# Patient Record
Sex: Female | Born: 1980 | Race: Black or African American | Hispanic: No | Marital: Single
Health system: Southern US, Community
[De-identification: ages and names within clinical notes are randomized; demographics above are authoritative.]

## PROBLEM LIST (undated history)

## (undated) DIAGNOSIS — F319 Bipolar disorder, unspecified: Secondary | ICD-10-CM

## (undated) DIAGNOSIS — F32A Depression, unspecified: Secondary | ICD-10-CM

## (undated) DIAGNOSIS — F329 Major depressive disorder, single episode, unspecified: Secondary | ICD-10-CM

## (undated) DIAGNOSIS — F909 Attention-deficit hyperactivity disorder, unspecified type: Secondary | ICD-10-CM

## (undated) HISTORY — PX: HAND SURGERY: SHX662

## (undated) HISTORY — PX: TUBAL LIGATION: SHX77

## (undated) HISTORY — PX: DENTAL SURGERY: SHX609

## (undated) HISTORY — PX: BUNIONECTOMY: SHX129

## (undated) HISTORY — PX: TONSILLECTOMY: SUR1361

---
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
## 2000-07-21 ENCOUNTER — Encounter: Admission: RE | Admit: 2000-07-21 | Discharge: 2000-07-21 | Payer: Self-pay

## 2002-07-20 ENCOUNTER — Emergency Department (HOSPITAL_COMMUNITY): Admission: EM | Admit: 2002-07-20 | Discharge: 2002-07-20 | Payer: Self-pay | Admitting: Emergency Medicine

## 2004-03-15 ENCOUNTER — Emergency Department (HOSPITAL_COMMUNITY): Admission: EM | Admit: 2004-03-15 | Discharge: 2004-03-15 | Payer: Self-pay | Admitting: Emergency Medicine

## 2004-03-31 ENCOUNTER — Emergency Department (HOSPITAL_COMMUNITY): Admission: EM | Admit: 2004-03-31 | Discharge: 2004-03-31 | Payer: Self-pay

## 2004-05-28 ENCOUNTER — Emergency Department (HOSPITAL_COMMUNITY): Admission: EM | Admit: 2004-05-28 | Discharge: 2004-05-28 | Payer: Self-pay | Admitting: Emergency Medicine

## 2004-05-29 ENCOUNTER — Emergency Department (HOSPITAL_COMMUNITY): Admission: EM | Admit: 2004-05-29 | Discharge: 2004-05-29 | Payer: Self-pay | Admitting: Emergency Medicine

## 2004-06-20 ENCOUNTER — Emergency Department (HOSPITAL_COMMUNITY): Admission: EM | Admit: 2004-06-20 | Discharge: 2004-06-20 | Payer: Self-pay | Admitting: Emergency Medicine

## 2004-07-24 ENCOUNTER — Emergency Department (HOSPITAL_COMMUNITY): Admission: EM | Admit: 2004-07-24 | Discharge: 2004-07-24 | Payer: Self-pay | Admitting: Emergency Medicine

## 2004-11-21 ENCOUNTER — Emergency Department (HOSPITAL_COMMUNITY): Admission: EM | Admit: 2004-11-21 | Discharge: 2004-11-21 | Payer: Self-pay | Admitting: Emergency Medicine

## 2004-12-21 ENCOUNTER — Emergency Department (HOSPITAL_COMMUNITY): Admission: EM | Admit: 2004-12-21 | Discharge: 2004-12-21 | Payer: Self-pay | Admitting: Emergency Medicine

## 2005-03-19 ENCOUNTER — Emergency Department (HOSPITAL_COMMUNITY): Admission: EM | Admit: 2005-03-19 | Discharge: 2005-03-19 | Payer: Self-pay | Admitting: Emergency Medicine

## 2005-05-04 ENCOUNTER — Emergency Department (HOSPITAL_COMMUNITY): Admission: EM | Admit: 2005-05-04 | Discharge: 2005-05-04 | Payer: Self-pay | Admitting: Emergency Medicine

## 2005-07-24 ENCOUNTER — Inpatient Hospital Stay (HOSPITAL_COMMUNITY): Admission: AD | Admit: 2005-07-24 | Discharge: 2005-07-24 | Payer: Self-pay | Admitting: Gynecology

## 2005-08-09 ENCOUNTER — Inpatient Hospital Stay (HOSPITAL_COMMUNITY): Admission: AD | Admit: 2005-08-09 | Discharge: 2005-08-09 | Payer: Self-pay | Admitting: Obstetrics and Gynecology

## 2005-08-12 ENCOUNTER — Ambulatory Visit (HOSPITAL_COMMUNITY): Admission: RE | Admit: 2005-08-12 | Discharge: 2005-08-12 | Payer: Self-pay | Admitting: Obstetrics and Gynecology

## 2005-08-20 ENCOUNTER — Ambulatory Visit: Payer: Self-pay | Admitting: *Deleted

## 2005-08-20 ENCOUNTER — Ambulatory Visit (HOSPITAL_COMMUNITY): Admission: RE | Admit: 2005-08-20 | Discharge: 2005-08-20 | Payer: Self-pay | Admitting: Obstetrics and Gynecology

## 2005-09-04 ENCOUNTER — Encounter: Payer: Self-pay | Admitting: Emergency Medicine

## 2005-09-04 ENCOUNTER — Observation Stay (HOSPITAL_COMMUNITY): Admission: AD | Admit: 2005-09-04 | Discharge: 2005-09-05 | Payer: Self-pay | Admitting: Gynecology

## 2005-09-27 ENCOUNTER — Inpatient Hospital Stay (HOSPITAL_COMMUNITY): Admission: AD | Admit: 2005-09-27 | Discharge: 2005-09-27 | Payer: Self-pay | Admitting: Obstetrics & Gynecology

## 2006-01-12 ENCOUNTER — Inpatient Hospital Stay (HOSPITAL_COMMUNITY): Admission: AD | Admit: 2006-01-12 | Discharge: 2006-01-12 | Payer: Self-pay | Admitting: Obstetrics

## 2006-01-18 ENCOUNTER — Inpatient Hospital Stay (HOSPITAL_COMMUNITY): Admission: AD | Admit: 2006-01-18 | Discharge: 2006-01-23 | Payer: Self-pay | Admitting: Obstetrics & Gynecology

## 2006-01-20 ENCOUNTER — Encounter (INDEPENDENT_AMBULATORY_CARE_PROVIDER_SITE_OTHER): Payer: Self-pay | Admitting: *Deleted

## 2006-06-24 ENCOUNTER — Emergency Department (HOSPITAL_COMMUNITY): Admission: EM | Admit: 2006-06-24 | Discharge: 2006-06-24 | Payer: Self-pay | Admitting: Emergency Medicine

## 2006-07-23 ENCOUNTER — Emergency Department (HOSPITAL_COMMUNITY): Admission: EM | Admit: 2006-07-23 | Discharge: 2006-07-23 | Payer: Self-pay | Admitting: *Deleted

## 2006-09-09 ENCOUNTER — Ambulatory Visit (HOSPITAL_COMMUNITY): Admission: RE | Admit: 2006-09-09 | Discharge: 2006-09-09 | Payer: Self-pay | Admitting: Obstetrics & Gynecology

## 2006-11-22 ENCOUNTER — Emergency Department (HOSPITAL_COMMUNITY): Admission: EM | Admit: 2006-11-22 | Discharge: 2006-11-22 | Payer: Self-pay | Admitting: Emergency Medicine

## 2007-01-04 ENCOUNTER — Emergency Department (HOSPITAL_COMMUNITY): Admission: EM | Admit: 2007-01-04 | Discharge: 2007-01-04 | Payer: Self-pay | Admitting: Emergency Medicine

## 2007-05-03 ENCOUNTER — Emergency Department (HOSPITAL_COMMUNITY): Admission: EM | Admit: 2007-05-03 | Discharge: 2007-05-03 | Payer: Self-pay | Admitting: Emergency Medicine

## 2007-06-27 ENCOUNTER — Emergency Department (HOSPITAL_COMMUNITY): Admission: EM | Admit: 2007-06-27 | Discharge: 2007-06-27 | Payer: Self-pay | Admitting: Emergency Medicine

## 2007-08-03 ENCOUNTER — Emergency Department (HOSPITAL_COMMUNITY): Admission: EM | Admit: 2007-08-03 | Discharge: 2007-08-03 | Payer: Self-pay | Admitting: Emergency Medicine

## 2007-09-14 ENCOUNTER — Emergency Department (HOSPITAL_COMMUNITY): Admission: EM | Admit: 2007-09-14 | Discharge: 2007-09-14 | Payer: Self-pay | Admitting: Emergency Medicine

## 2008-02-06 ENCOUNTER — Inpatient Hospital Stay (HOSPITAL_COMMUNITY): Admission: AD | Admit: 2008-02-06 | Discharge: 2008-02-07 | Payer: Self-pay | Admitting: Obstetrics

## 2008-02-08 ENCOUNTER — Encounter: Payer: Self-pay | Admitting: Obstetrics

## 2008-02-08 ENCOUNTER — Inpatient Hospital Stay (HOSPITAL_COMMUNITY): Admission: AD | Admit: 2008-02-08 | Discharge: 2008-02-08 | Payer: Self-pay | Admitting: Obstetrics

## 2008-02-25 ENCOUNTER — Emergency Department (HOSPITAL_COMMUNITY): Admission: EM | Admit: 2008-02-25 | Discharge: 2008-02-25 | Payer: Self-pay | Admitting: Emergency Medicine

## 2008-03-11 ENCOUNTER — Emergency Department (HOSPITAL_COMMUNITY): Admission: EM | Admit: 2008-03-11 | Discharge: 2008-03-11 | Payer: Self-pay | Admitting: Emergency Medicine

## 2008-11-02 ENCOUNTER — Ambulatory Visit (HOSPITAL_COMMUNITY): Admission: RE | Admit: 2008-11-02 | Discharge: 2008-11-02 | Payer: Self-pay | Admitting: Obstetrics & Gynecology

## 2008-11-23 ENCOUNTER — Ambulatory Visit (HOSPITAL_COMMUNITY): Admission: RE | Admit: 2008-11-23 | Discharge: 2008-11-23 | Payer: Self-pay | Admitting: Obstetrics & Gynecology

## 2008-12-14 ENCOUNTER — Ambulatory Visit (HOSPITAL_COMMUNITY): Admission: RE | Admit: 2008-12-14 | Discharge: 2008-12-14 | Payer: Self-pay | Admitting: Obstetrics & Gynecology

## 2008-12-23 ENCOUNTER — Inpatient Hospital Stay (HOSPITAL_COMMUNITY): Admission: AD | Admit: 2008-12-23 | Discharge: 2008-12-23 | Payer: Self-pay | Admitting: Obstetrics & Gynecology

## 2008-12-23 ENCOUNTER — Ambulatory Visit: Payer: Self-pay | Admitting: Obstetrics and Gynecology

## 2009-01-11 ENCOUNTER — Ambulatory Visit (HOSPITAL_COMMUNITY): Admission: RE | Admit: 2009-01-11 | Discharge: 2009-01-11 | Payer: Self-pay | Admitting: Obstetrics & Gynecology

## 2009-02-08 ENCOUNTER — Ambulatory Visit (HOSPITAL_COMMUNITY): Admission: RE | Admit: 2009-02-08 | Discharge: 2009-02-08 | Payer: Self-pay | Admitting: Obstetrics & Gynecology

## 2009-03-08 ENCOUNTER — Emergency Department (HOSPITAL_COMMUNITY): Admission: EM | Admit: 2009-03-08 | Discharge: 2009-03-08 | Payer: Self-pay | Admitting: Emergency Medicine

## 2009-03-16 IMAGING — CR DG RIBS W/ CHEST 3+V*R*
5 series · 5 of 5 positions shown · non-contrast
Comparison: none

CLINICAL DATA: Right lower anterior rib pain since a fall [REDACTED].
 RIGHT RIBS WITH PA CHEST:

[w chest pa]
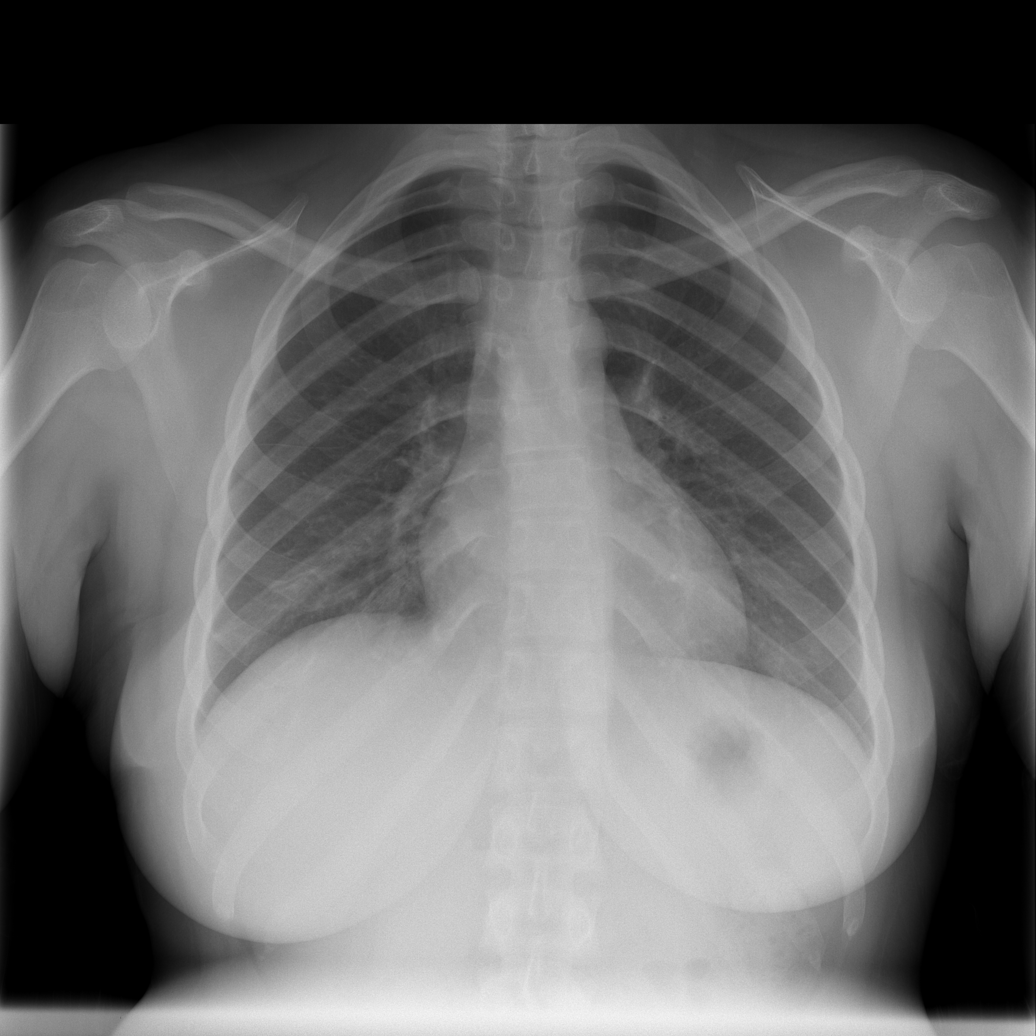

[w ribs ap/pa upper right]
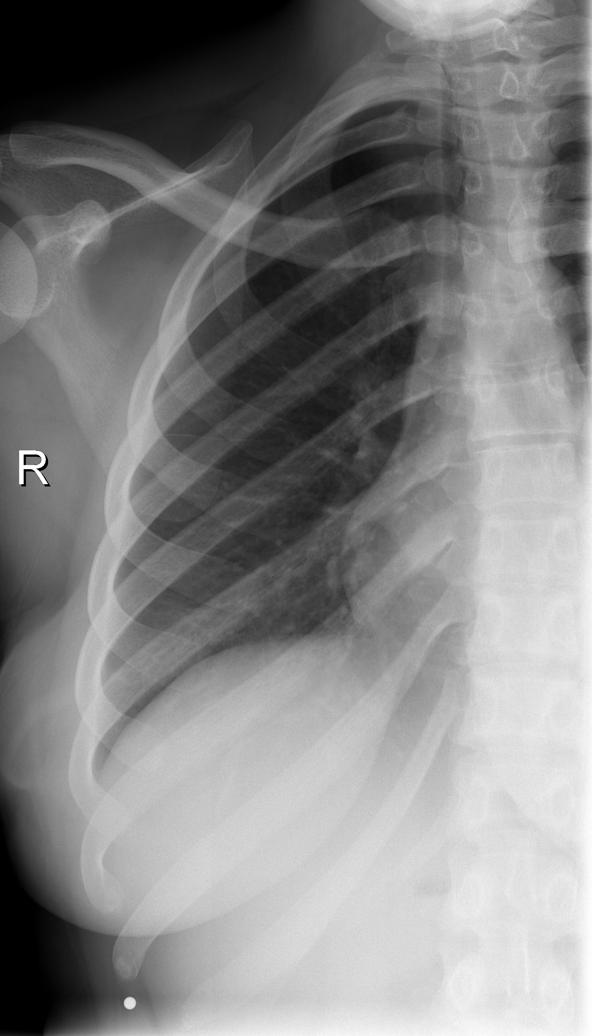

[w ribs ap/pa lower right]
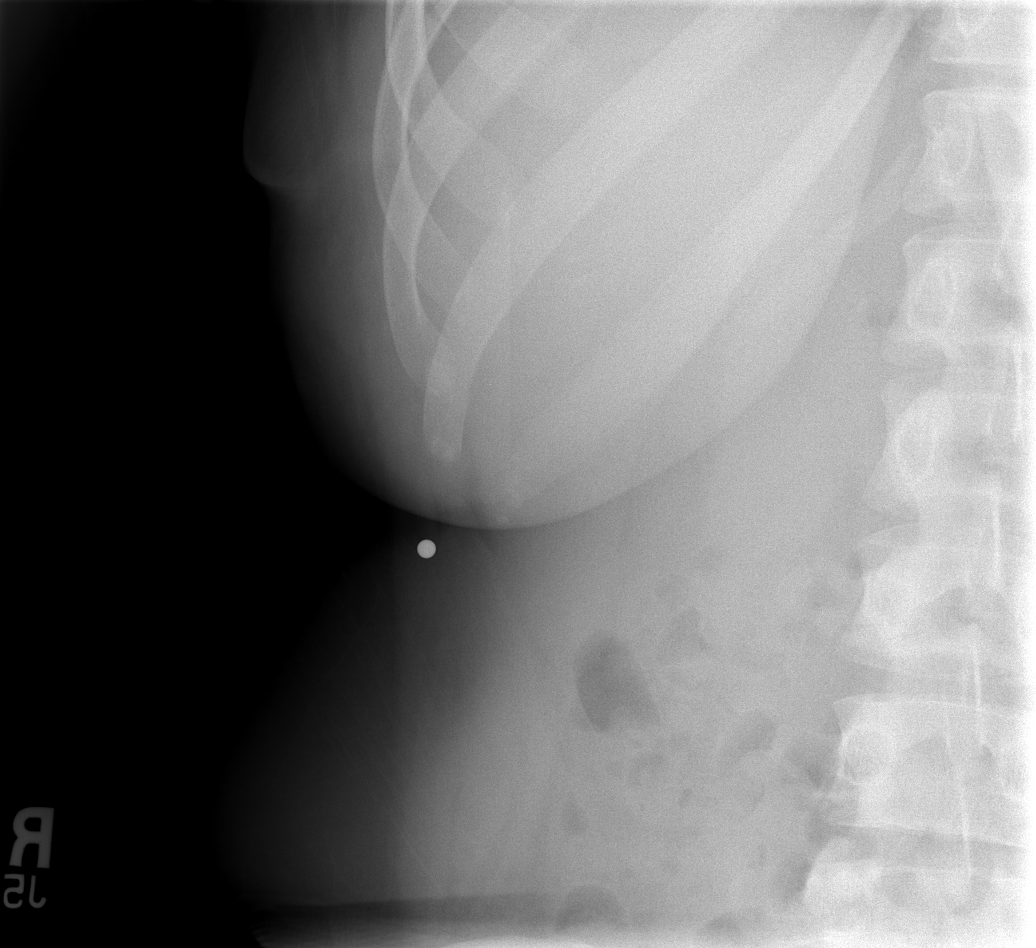

[w ribs oblique right * (1 of 2)]
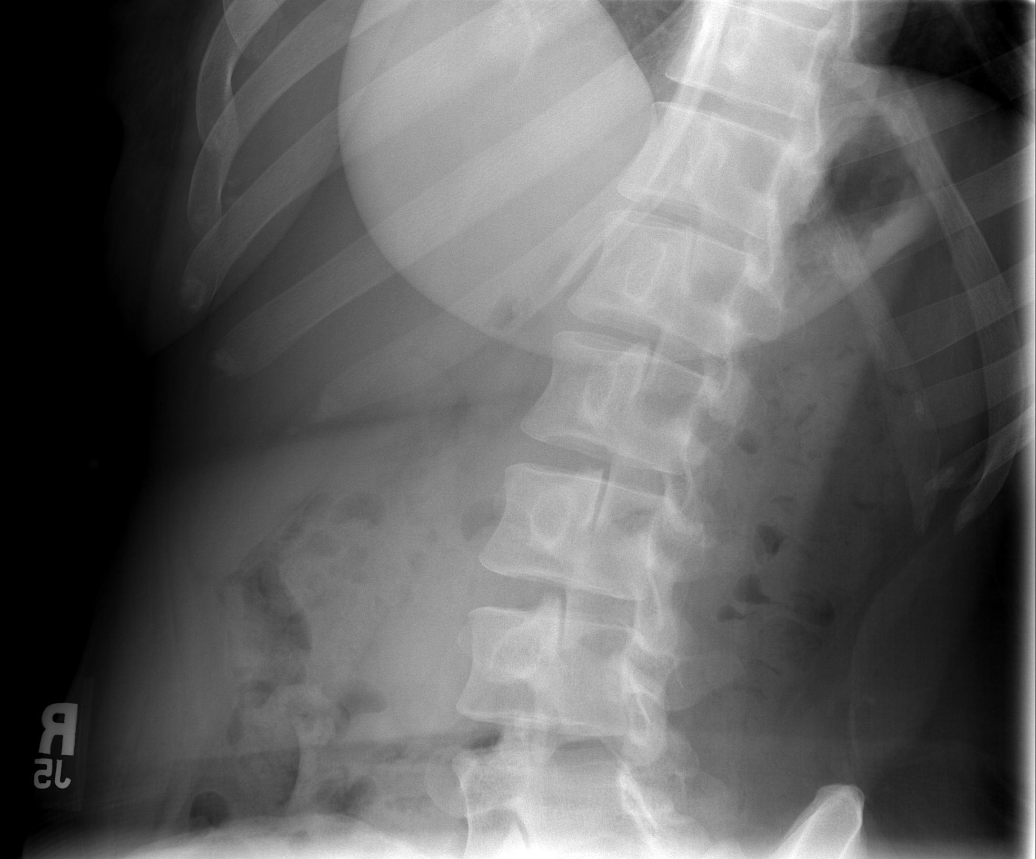

[w ribs oblique right * (2 of 2)]
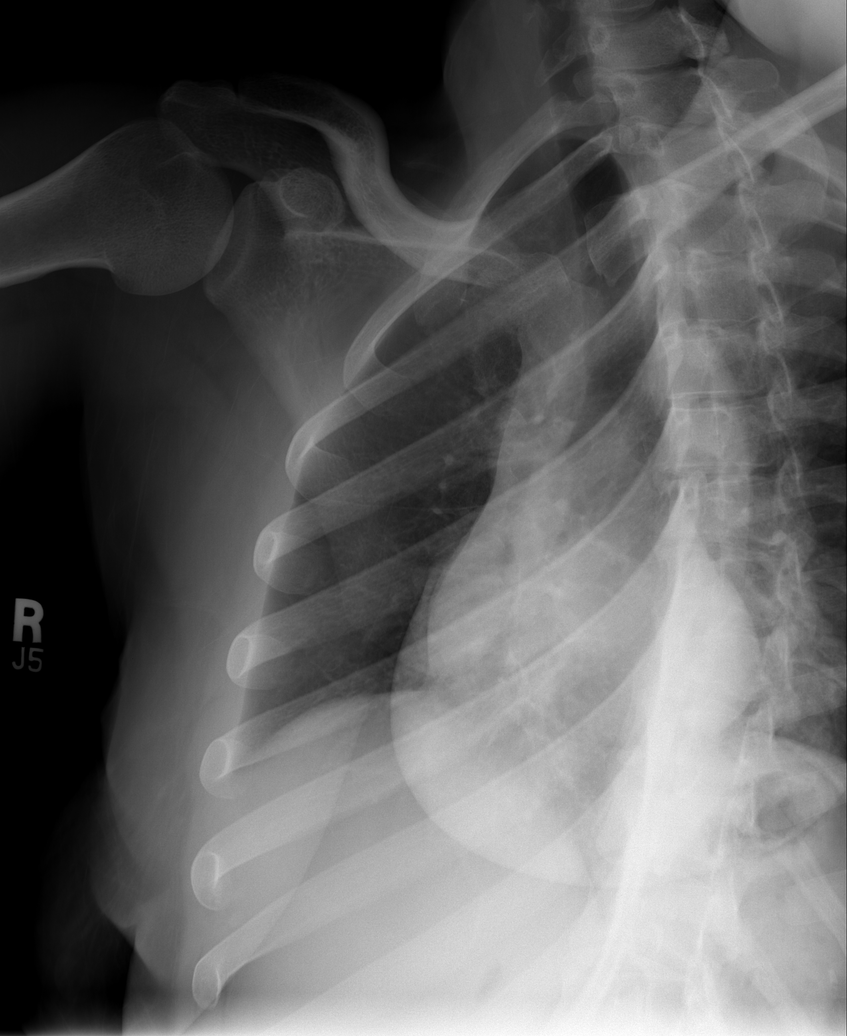

[5 of 5 positions shown; findings below may reference images not displayed]

FINDINGS: There is no visible definitive fracture, although there is slight irregularity of the anterior aspect of the right 9th rib at the costochondral junction which could represent a slight cortical fracture.  The patient?s pain was marked with a BB and this does have overlie the 9th to 10th costochondral junction region.  There is some mild atelectasis of the right lung base, but there is no pneumothorax. Lungs are otherwise clear.
IMPRESSION: No definitive fracture.  However, a single view does demonstrate some slight cortical irregularity of the anterior aspect of the right 9th rib, which may be a subtle fracture. Mild right base atelectasis.

## 2009-03-17 ENCOUNTER — Inpatient Hospital Stay (HOSPITAL_COMMUNITY): Admission: AD | Admit: 2009-03-17 | Discharge: 2009-03-17 | Payer: Self-pay | Admitting: Obstetrics & Gynecology

## 2009-03-23 ENCOUNTER — Inpatient Hospital Stay (HOSPITAL_COMMUNITY): Admission: AD | Admit: 2009-03-23 | Discharge: 2009-03-23 | Payer: Self-pay | Admitting: Obstetrics

## 2009-04-05 ENCOUNTER — Ambulatory Visit (HOSPITAL_COMMUNITY): Admission: RE | Admit: 2009-04-05 | Discharge: 2009-04-05 | Payer: Self-pay | Admitting: Obstetrics & Gynecology

## 2009-04-30 ENCOUNTER — Inpatient Hospital Stay (HOSPITAL_COMMUNITY): Admission: AD | Admit: 2009-04-30 | Discharge: 2009-05-03 | Payer: Self-pay | Admitting: Obstetrics & Gynecology

## 2009-04-30 ENCOUNTER — Encounter: Payer: Self-pay | Admitting: Obstetrics & Gynecology

## 2009-06-22 ENCOUNTER — Other Ambulatory Visit: Payer: Self-pay | Admitting: Obstetrics & Gynecology

## 2009-08-26 ENCOUNTER — Emergency Department (HOSPITAL_COMMUNITY): Admission: EM | Admit: 2009-08-26 | Discharge: 2009-08-26 | Payer: Self-pay | Admitting: Emergency Medicine

## 2009-09-24 ENCOUNTER — Inpatient Hospital Stay (HOSPITAL_COMMUNITY): Admission: AD | Admit: 2009-09-24 | Discharge: 2009-09-24 | Payer: Self-pay | Admitting: Obstetrics & Gynecology

## 2010-01-13 ENCOUNTER — Emergency Department (HOSPITAL_COMMUNITY): Admission: EM | Admit: 2010-01-13 | Discharge: 2010-01-13 | Payer: Self-pay | Admitting: Emergency Medicine

## 2010-05-27 ENCOUNTER — Encounter: Payer: Self-pay | Admitting: Obstetrics & Gynecology

## 2010-05-28 ENCOUNTER — Encounter: Payer: Self-pay | Admitting: Obstetrics & Gynecology

## 2010-07-23 LAB — POCT PREGNANCY, URINE: Preg Test, Ur: NEGATIVE

## 2010-07-23 LAB — URINALYSIS, ROUTINE W REFLEX MICROSCOPIC
Bilirubin Urine: NEGATIVE
Leukocytes, UA: NEGATIVE

## 2010-07-23 LAB — URINE MICROSCOPIC-ADD ON

## 2010-07-23 LAB — WET PREP, GENITAL
Clue Cells Wet Prep HPF POC: NONE SEEN
Trich, Wet Prep: NONE SEEN

## 2010-07-25 LAB — CBC
HCT: 42.6 % (ref 36.0–46.0)
Hemoglobin: 13.7 g/dL (ref 12.0–15.0)
MCV: 89.5 fL (ref 78.0–100.0)
RBC: 4.76 MIL/uL (ref 3.87–5.11)
WBC: 9.3 10*3/uL (ref 4.0–10.5)

## 2010-07-26 LAB — ABO/RH: RH Type: POSITIVE

## 2010-07-26 LAB — RPR: RPR: NONREACTIVE

## 2010-08-06 LAB — CBC
HCT: 39.3 % (ref 36.0–46.0)
Hemoglobin: 12.9 g/dL (ref 12.0–15.0)
MCHC: 32.5 g/dL (ref 30.0–36.0)
MCHC: 32.7 g/dL (ref 30.0–36.0)
MCV: 88.8 fL (ref 78.0–100.0)
MCV: 89.4 fL (ref 78.0–100.0)
Platelets: 231 10*3/uL (ref 150–400)
RDW: 13.1 % (ref 11.5–15.5)
RDW: 13.4 % (ref 11.5–15.5)

## 2010-08-08 LAB — URINALYSIS, ROUTINE W REFLEX MICROSCOPIC
Ketones, ur: NEGATIVE mg/dL
Protein, ur: NEGATIVE mg/dL
Urobilinogen, UA: 0.2 mg/dL (ref 0.0–1.0)

## 2010-08-08 LAB — URINE MICROSCOPIC-ADD ON

## 2010-08-11 LAB — CBC
HCT: 36.2 % (ref 36.0–46.0)
MCHC: 33.3 g/dL (ref 30.0–36.0)
MCV: 88.1 fL (ref 78.0–100.0)
Platelets: 205 10*3/uL (ref 150–400)
RDW: 13.9 % (ref 11.5–15.5)

## 2010-08-11 LAB — DIFFERENTIAL
Basophils Absolute: 0 10*3/uL (ref 0.0–0.1)
Basophils Relative: 0 % (ref 0–1)
Eosinophils Absolute: 0.2 10*3/uL (ref 0.0–0.7)
Eosinophils Relative: 1 % (ref 0–5)

## 2010-08-11 LAB — WET PREP, GENITAL
Trich, Wet Prep: NONE SEEN
Yeast Wet Prep HPF POC: NONE SEEN
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
## 2010-09-21 NOTE — Op Note (Signed)
NAMEMarland [name]  JENASIS, STRALEY      ACCOUNT NO.:  [id number]   MEDICAL RECORD NO.:  [id number]          PATIENT TYPE:  INP   LOCATION:  9101                          FACILITY:  [facility]   PHYSICIAN:  [name], M.D.DATE OF BIRTH:  1981-01-29   DATE OF PROCEDURE:  01/20/2006  DATE OF DISCHARGE:                                 OPERATIVE REPORT   PREOPERATIVE DIAGNOSIS:  Intrauterine pregnancy at term, arrest of  dilatation, active phase of labor.   POSTOPERATIVE DIAGNOSIS:  Intrauterine pregnancy at term, arrest of  dilatation, active phase of labor, persistent occiput posterior.   PROCEDURE:  Primary low uterine flap elliptical cesarean delivery via  Pfannenstiel skin incision.   SURGEON:  Jackson-Moore.   ANESTHESIA:  Epidural.   PATHOLOGY:  Placenta.   ESTIMATED BLOOD LOSS:  600 mL.   COMPLICATIONS:  None.   PROCEDURE:  The patient is taken to the operating room with an epidural  catheter in situ and an IV running.  The epidural was dosed.  She was placed  in the dorsal supine position with a leftward tilt and prepped and draped in  the usual sterile fashion.  A Pfannenstiel skin incision was made then made  with the scalpel and carried down to the underlying fascia with the Bovie.  The fascia was nicked in the midline.  The fascial incision was then  extended bilaterally with curved Mayo scissors.  The superior aspect of the  fascial incision was then tented up and the underlying rectus muscles  dissected off.  The inferior aspect of the fascial incision was manipulated  in a similar fashion.  The rectus muscles were separated in the midline.  The parietal peritoneum was tented up and entered sharply with Metzenbaum  scissors.  The peritoneal incision was then extended superiorly and  inferiorly with good visualization of the bladder.  The bladder blade was  then placed.  The vesicouterine peritoneum was tented up and entered sharply  with Metzenbaum scissors.   This incision was then extended bilaterally and a  bladder flap created bluntly.  The bladder blade was then placed.  The lower  uterine segment was incised in transverse fashion with the scalpel.  The  uterine incision was extended bluntly.  The infant's head was then delivered  atraumatically.  The oropharynx was suctioned with bulb suction.  The cord  was clamped and cut.  The infant was handed off to the waiting neonatal  neonatologist.  The patient was delivered of a live born female occiput  posterior.  The Apgars were 9 at one in 5 minutes respectively.  The  placenta was then removed.  The intrauterine cavity was evacuated of any  remaining amniotic fluid, clots and debris with moist laparotomy sponge.  The uterine incision was then reapproximated in a running interlocking  fashion using 0 Monocryl.  A second imbricating suture of the same was then  placed to obtain adequate hemostasis.  The paracolic gutters were then  copiously irrigated.  The parietal  peritoneum was reapproximated in a running fashion with 2-0 Vicryl.  The  fascia was reapproximated in a running fashion with 0 Vicryl.  The skin  was  closed with staples.  At the close of the procedure the instrument and pack  counts were said to be correct x2.  The patient was taken to the PACU awake  and in stable condition.      [name], M.D.  Electronically Signed     LAJ/MEDQ  D:  01/20/2006  T:  01/20/2006  Job:  [id number]

## 2010-09-21 NOTE — Discharge Summary (Signed)
NAMEFRANCHESKA, VILLEDA         ACCOUNT NO.:  [id number]   MEDICAL RECORD NO.:  [id number]          PATIENT TYPE:  INP   LOCATION:  9101                          [facility]ACILITY:  [facility]   PHYSICIAN:  [name], M.D.DATE OF BIRTH:  August 28, 1980   DATE OF ADMISSION:  01/18/2006  DATE OF DISCHARGE:  01/23/2006                                 DISCHARGE SUMMARY   CHIEF COMPLAINT:  The patient is a 30 year old gravida 1, para 0, with an  estimated date of confinement of September15, with an intrauterine pregnancy  at 40 weeks for induction of labor.  Please see the dictated history and  physical.   HOSPITAL COURSE:  The patient was admitted. The intrapartum course was  remarkable for fetal prolonged decelerations.  She progressed to 7-8 cm of  dilatation.  There was no further progress at this point, and she was  brought for a cesarean delivery.  Please see the dictated operative summary.  On postoperative day 1, her hemoglobin was 11.  Social work was consulted  regarding her complex social situation.  She was discharged to home on  postoperative day 3, tolerating a regular diet.  There were plans for CPS to  continue to be involved.  The patient received Depo-Provera for  contraception prior to discharge.   DISCHARGE DIAGNOSES:  1. Intrauterine pregnancy at term.  2. Arrest of dilatation, active phase.   PROCEDURE:  Cesarean delivery.   CONDITION:  Stable.   DIET:  Regular.   ACTIVITY:  Progressive activity.  Pelvic rest.   MEDICATIONS:  Percocet, ibuprofen, over-the-counter stool softeners.   DISPOSITION:  The patient was to follow up in the office in 1-2 weeks.      [name], M.D.  Electronically Signed     LAJ/MEDQ  D:  02/19/2006  T:  02/20/2006  Job:  [id number]

## 2010-10-24 ENCOUNTER — Other Ambulatory Visit: Payer: Self-pay | Admitting: Obstetrics & Gynecology

## 2010-10-24 DIAGNOSIS — Q6601 Congenital talipes equinovarus, right foot: Secondary | ICD-10-CM

## 2010-10-25 ENCOUNTER — Ambulatory Visit (HOSPITAL_COMMUNITY): Payer: Self-pay

## 2010-10-31 ENCOUNTER — Other Ambulatory Visit: Payer: Self-pay | Admitting: Obstetrics & Gynecology

## 2010-10-31 ENCOUNTER — Ambulatory Visit (HOSPITAL_COMMUNITY)
Admission: RE | Admit: 2010-10-31 | Discharge: 2010-10-31 | Disposition: A | Discharge status: Home / Self Care | Payer: Medicaid Other | Source: Ambulatory Visit | Attending: Obstetrics & Gynecology | Admitting: Obstetrics & Gynecology

## 2010-10-31 DIAGNOSIS — O99891 Other specified diseases and conditions complicating pregnancy: Secondary | ICD-10-CM | POA: Insufficient documentation

## 2010-10-31 DIAGNOSIS — Q6602 Congenital talipes equinovarus, left foot: Secondary | ICD-10-CM

## 2010-10-31 DIAGNOSIS — O99330 Smoking (tobacco) complicating pregnancy, unspecified trimester: Secondary | ICD-10-CM | POA: Insufficient documentation

## 2010-10-31 DIAGNOSIS — Q6601 Congenital talipes equinovarus, right foot: Secondary | ICD-10-CM

## 2010-10-31 DIAGNOSIS — F79 Unspecified intellectual disabilities: Secondary | ICD-10-CM | POA: Insufficient documentation

## 2010-11-28 ENCOUNTER — Encounter (HOSPITAL_COMMUNITY): Payer: Self-pay

## 2010-11-28 ENCOUNTER — Ambulatory Visit (HOSPITAL_COMMUNITY)
Admission: RE | Admit: 2010-11-28 | Discharge: 2010-11-28 | Disposition: A | Discharge status: Home / Self Care | Payer: Medicaid Other | Source: Ambulatory Visit | Attending: Obstetrics & Gynecology | Admitting: Obstetrics & Gynecology

## 2010-11-28 DIAGNOSIS — Q6601 Congenital talipes equinovarus, right foot: Secondary | ICD-10-CM

## 2010-11-28 DIAGNOSIS — Z8279 Family history of other congenital malformations, deformations and chromosomal abnormalities: Secondary | ICD-10-CM

## 2010-11-28 DIAGNOSIS — Z3689 Encounter for other specified antenatal screening: Secondary | ICD-10-CM | POA: Insufficient documentation

## 2010-11-28 DIAGNOSIS — O99330 Smoking (tobacco) complicating pregnancy, unspecified trimester: Secondary | ICD-10-CM | POA: Insufficient documentation

## 2010-11-28 NOTE — Progress Notes (Signed)
Report in AS/EPIC; follow-up as needed 

## 2010-12-13 ENCOUNTER — Inpatient Hospital Stay (HOSPITAL_COMMUNITY)
Admission: AD | Admit: 2010-12-13 | Discharge: 2010-12-13 | Disposition: A | Discharge status: Home / Self Care | Payer: Medicaid Other | Source: Ambulatory Visit | Attending: Obstetrics | Admitting: Obstetrics

## 2010-12-13 DIAGNOSIS — O2390 Unspecified genitourinary tract infection in pregnancy, unspecified trimester: Secondary | ICD-10-CM | POA: Insufficient documentation

## 2010-12-13 DIAGNOSIS — B9689 Other specified bacterial agents as the cause of diseases classified elsewhere: Secondary | ICD-10-CM | POA: Insufficient documentation

## 2010-12-13 DIAGNOSIS — N760 Acute vaginitis: Secondary | ICD-10-CM

## 2010-12-13 DIAGNOSIS — A499 Bacterial infection, unspecified: Secondary | ICD-10-CM

## 2010-12-13 DIAGNOSIS — N888 Other specified noninflammatory disorders of cervix uteri: Secondary | ICD-10-CM | POA: Insufficient documentation

## 2010-12-13 LAB — URINALYSIS, ROUTINE W REFLEX MICROSCOPIC
Glucose, UA: NEGATIVE mg/dL
Ketones, ur: NEGATIVE mg/dL
Protein, ur: NEGATIVE mg/dL
Urobilinogen, UA: 0.2 mg/dL (ref 0.0–1.0)

## 2010-12-13 LAB — URINE MICROSCOPIC-ADD ON

## 2010-12-13 LAB — WET PREP, GENITAL: WBC, Wet Prep HPF POC: NONE SEEN

## 2010-12-13 MED ORDER — METRONIDAZOLE 500 MG PO TABS: 500.0000 mg | ORAL_TABLET | Freq: Two times a day (BID) | ORAL | Status: AC

## 2010-12-13 NOTE — Progress Notes (Addendum)
Pt states lost mucous plug this am, had blood tinged discharge when she wiped minutes ago in MAU. Having r sided groin pain, excruciating headache that began in ambulance this afternoon, +FM, denies feeling u/c's. Had sex Friday, noted spotting Sunday.
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
## 2011-01-25 LAB — PREGNANCY, URINE: Preg Test, Ur: NEGATIVE

## 2011-01-25 LAB — WET PREP, GENITAL

## 2011-01-25 LAB — GC/CHLAMYDIA PROBE AMP, GENITAL: GC Probe Amp, Genital: NEGATIVE

## 2011-02-04 LAB — URINALYSIS, ROUTINE W REFLEX MICROSCOPIC
Ketones, ur: 15 — AB
Leukocytes, UA: NEGATIVE
Nitrite: NEGATIVE
Protein, ur: NEGATIVE
pH: 5.5

## 2011-02-04 LAB — CBC
Hemoglobin: 12.2
MCHC: 32.5
MCV: 86.3
RBC: 4.36

## 2011-02-04 LAB — POCT PREGNANCY, URINE: Preg Test, Ur: NEGATIVE

## 2011-02-04 LAB — WET PREP, GENITAL: Yeast Wet Prep HPF POC: NONE SEEN

## 2011-02-05 LAB — CBC
HCT: 43.8
Hemoglobin: 13.9
WBC: 12.9 — ABNORMAL HIGH

## 2011-02-05 LAB — DIFFERENTIAL
Eosinophils Relative: 1
Lymphocytes Relative: 14
Lymphs Abs: 1.8
Monocytes Absolute: 0.5

## 2011-02-05 LAB — POCT I-STAT, CHEM 8
BUN: 4 — ABNORMAL LOW
Calcium, Ion: 1.19
Creatinine, Ser: 0.9
Glucose, Bld: 94
TCO2: 23

## 2011-02-08 LAB — POCT PREGNANCY, URINE
Operator id: 294521
Preg Test, Ur: NEGATIVE

## 2011-02-15 LAB — URINALYSIS, ROUTINE W REFLEX MICROSCOPIC
Glucose, UA: NEGATIVE
Hgb urine dipstick: NEGATIVE
Specific Gravity, Urine: 1.01
Urobilinogen, UA: 0.2

## 2011-02-15 LAB — POCT PREGNANCY, URINE
Operator id: 22937
Preg Test, Ur: NEGATIVE

## 2011-03-07 ENCOUNTER — Other Ambulatory Visit: Payer: Self-pay | Admitting: Obstetrics & Gynecology

## 2011-03-10 ENCOUNTER — Encounter (HOSPITAL_COMMUNITY): Payer: Self-pay

## 2011-03-19 ENCOUNTER — Encounter (HOSPITAL_COMMUNITY)
Admission: RE | Admit: 2011-03-19 | Discharge: 2011-03-19 | Disposition: A | Discharge status: Home / Self Care | Payer: Medicaid Other | Source: Ambulatory Visit | Attending: Obstetrics & Gynecology | Admitting: Obstetrics & Gynecology

## 2011-03-19 ENCOUNTER — Encounter (HOSPITAL_COMMUNITY): Payer: Self-pay

## 2011-03-19 HISTORY — DX: Bipolar disorder, unspecified: F31.9

## 2011-03-19 HISTORY — DX: Major depressive disorder, single episode, unspecified: F32.9

## 2011-03-19 HISTORY — DX: Depression, unspecified: F32.A

## 2011-03-19 HISTORY — DX: Attention-deficit hyperactivity disorder, unspecified type: F90.9

## 2011-03-19 LAB — RPR: RPR Ser Ql: NONREACTIVE

## 2011-03-19 LAB — CBC
MCV: 87.4 fL (ref 78.0–100.0)
Platelets: 250 10*3/uL (ref 150–400)
RBC: 4.75 MIL/uL (ref 3.87–5.11)
WBC: 18.3 10*3/uL — ABNORMAL HIGH (ref 4.0–10.5)

## 2011-03-19 MED ORDER — CEFAZOLIN SODIUM-DEXTROSE 2-3 GM-% IV SOLR
2.0000 g | INTRAVENOUS | Status: AC
  Administered 2011-03-20: 2 g via INTRAVENOUS
  Filled 2011-03-19: qty 50

## 2011-03-19 NOTE — Patient Instructions (Addendum)
YOUR PROCEDURE IS SCHEDULED ON:Wed. 03/20/11  ENTER THROUGH THE MAIN ENTRANCE OF [facility] AT:11:30 am   USE DESK PHONE AND DIAL [id number] TO INFORM us OF YOUR ARRIVAL  CALL [phone] IF YOU HAVE ANY QUESTIONS OR PROBLEMS PRIOR TO YOUR ARRIVAL.  REMEMBER: DO NOT EAT AFTER MIDNIGHT : tonight  SPECIAL INSTRUCTIONS:water is ok until 7:30 am Wed.   YOU MAY BRUSH YOUR TEETH THE MORNING OF SURGERY   TAKE THESE MEDICINES THE DAY OF SURGERY WITH SIP OF WATER: none   DO NOT WEAR JEWELRY, EYE MAKEUP, LIPSTICK OR DARK FINGERNAIL POLISH DO NOT WEAR LOTIONS OR DEODORANT DO NOT SHAVE FOR 48 HOURS PRIOR TO SURGERY  YOU WILL NOT BE ALLOWED TO DRIVE YOURSELF HOME.  NAME OF DRIVERMargarette Asal - [phone]

## 2011-03-20 ENCOUNTER — Encounter (HOSPITAL_COMMUNITY)
Admission: RE | Disposition: A | Discharge status: Home / Self Care | Payer: Self-pay | Source: Ambulatory Visit | Attending: Obstetrics & Gynecology

## 2011-03-20 ENCOUNTER — Inpatient Hospital Stay (HOSPITAL_COMMUNITY): Payer: Medicaid Other | Admitting: Anesthesiology

## 2011-03-20 ENCOUNTER — Encounter (HOSPITAL_COMMUNITY): Payer: Self-pay | Admitting: Anesthesiology

## 2011-03-20 ENCOUNTER — Inpatient Hospital Stay (HOSPITAL_COMMUNITY)
Admission: RE | Admit: 2011-03-20 | Discharge: 2011-03-23 | DRG: 766 | Disposition: A | Discharge status: Home / Self Care | Payer: Medicaid Other | Source: Ambulatory Visit | Attending: Obstetrics & Gynecology | Admitting: Obstetrics & Gynecology

## 2011-03-20 ENCOUNTER — Encounter (HOSPITAL_COMMUNITY): Payer: Self-pay | Admitting: *Deleted

## 2011-03-20 ENCOUNTER — Other Ambulatory Visit: Payer: Self-pay | Admitting: Obstetrics & Gynecology

## 2011-03-20 DIAGNOSIS — O34219 Maternal care for unspecified type scar from previous cesarean delivery: Principal | ICD-10-CM | POA: Diagnosis present

## 2011-03-20 DIAGNOSIS — Z01818 Encounter for other preprocedural examination: Secondary | ICD-10-CM

## 2011-03-20 DIAGNOSIS — Z01812 Encounter for preprocedural laboratory examination: Secondary | ICD-10-CM

## 2011-03-20 DIAGNOSIS — Z302 Encounter for sterilization: Secondary | ICD-10-CM

## 2011-03-20 LAB — TYPE AND SCREEN
ABO/RH(D): A POS
Antibody Screen: NEGATIVE

## 2011-03-20 SURGERY — Surgical Case
Anesthesia: Spinal | Wound class: Clean Contaminated

## 2011-03-20 MED ORDER — OXYTOCIN 20 UNITS IN LACTATED RINGERS INFUSION - SIMPLE
INTRAVENOUS | Status: AC
  Administered 2011-03-20: 20 [IU]
  Filled 2011-03-20: qty 1000

## 2011-03-20 MED ORDER — ONDANSETRON HCL 4 MG/2ML IJ SOLN: 4.0000 mg | INTRAMUSCULAR | Status: DC | PRN

## 2011-03-20 MED ORDER — MIDAZOLAM HCL 2 MG/2ML IJ SOLN
INTRAMUSCULAR | Status: AC
  Filled 2011-03-20: qty 2

## 2011-03-20 MED ORDER — NALOXONE HCL 0.4 MG/ML IJ SOLN: 0.4000 mg | INTRAMUSCULAR | Status: DC | PRN

## 2011-03-20 MED ORDER — DIPHENHYDRAMINE HCL 25 MG PO CAPS
25.0000 mg | ORAL_CAPSULE | ORAL | Status: DC | PRN
  Administered 2011-03-20: 25 mg via ORAL
  Filled 2011-03-20: qty 1

## 2011-03-20 MED ORDER — ONDANSETRON HCL 4 MG/2ML IJ SOLN
INTRAMUSCULAR | Status: DC | PRN
  Administered 2011-03-20: 4 mg via INTRAVENOUS

## 2011-03-20 MED ORDER — ZOLPIDEM TARTRATE 5 MG PO TABS: 5.0000 mg | ORAL_TABLET | Freq: Every evening | ORAL | Status: DC | PRN

## 2011-03-20 MED ORDER — MEPERIDINE HCL 25 MG/ML IJ SOLN: 6.2500 mg | INTRAMUSCULAR | Status: DC | PRN

## 2011-03-20 MED ORDER — OXYTOCIN 20 UNITS IN LACTATED RINGERS INFUSION - SIMPLE: 125.0000 mL/h | INTRAVENOUS | Status: AC

## 2011-03-20 MED ORDER — DIPHENHYDRAMINE HCL 50 MG/ML IJ SOLN: 25.0000 mg | INTRAMUSCULAR | Status: DC | PRN

## 2011-03-20 MED ORDER — TETANUS-DIPHTH-ACELL PERTUSSIS 5-2.5-18.5 LF-MCG/0.5 IM SUSP: 0.5000 mL | Freq: Once | INTRAMUSCULAR | Status: DC

## 2011-03-20 MED ORDER — IBUPROFEN 600 MG PO TABS
600.0000 mg | ORAL_TABLET | Freq: Four times a day (QID) | ORAL | Status: DC
  Administered 2011-03-20 – 2011-03-23 (×10): 600 mg via ORAL
  Filled 2011-03-20 (×10): qty 1

## 2011-03-20 MED ORDER — MORPHINE SULFATE (PF) 0.5 MG/ML IJ SOLN
INTRAMUSCULAR | Status: DC | PRN
  Administered 2011-03-20: .1 ug via INTRATHECAL

## 2011-03-20 MED ORDER — METOCLOPRAMIDE HCL 5 MG/ML IJ SOLN: 10.0000 mg | Freq: Three times a day (TID) | INTRAMUSCULAR | Status: DC | PRN

## 2011-03-20 MED ORDER — ONDANSETRON HCL 4 MG PO TABS: 4.0000 mg | ORAL_TABLET | ORAL | Status: DC | PRN

## 2011-03-20 MED ORDER — DIPHENHYDRAMINE HCL 50 MG/ML IJ SOLN: 12.5000 mg | INTRAMUSCULAR | Status: DC | PRN

## 2011-03-20 MED ORDER — FENTANYL CITRATE 0.05 MG/ML IJ SOLN
INTRAMUSCULAR | Status: AC
  Filled 2011-03-20: qty 2

## 2011-03-20 MED ORDER — NALBUPHINE SYRINGE 5 MG/0.5 ML
5.0000 mg | INJECTION | INTRAMUSCULAR | Status: DC | PRN
  Administered 2011-03-20: 10 mg via INTRAVENOUS
  Filled 2011-03-20 (×2): qty 1

## 2011-03-20 MED ORDER — ONDANSETRON HCL 4 MG/2ML IJ SOLN: 4.0000 mg | Freq: Three times a day (TID) | INTRAMUSCULAR | Status: DC | PRN

## 2011-03-20 MED ORDER — FERROUS SULFATE 325 (65 FE) MG PO TABS
325.0000 mg | ORAL_TABLET | Freq: Two times a day (BID) | ORAL | Status: DC
  Administered 2011-03-21 – 2011-03-23 (×5): 325 mg via ORAL
  Filled 2011-03-20 (×5): qty 1

## 2011-03-20 MED ORDER — SCOPOLAMINE 1 MG/3DAYS TD PT72
1.0000 | MEDICATED_PATCH | Freq: Once | TRANSDERMAL | Status: DC
  Filled 2011-03-20: qty 1

## 2011-03-20 MED ORDER — SODIUM CHLORIDE 0.9 % IJ SOLN: 3.0000 mL | INTRAMUSCULAR | Status: DC | PRN

## 2011-03-20 MED ORDER — ONDANSETRON HCL 4 MG/2ML IJ SOLN
INTRAMUSCULAR | Status: AC
  Filled 2011-03-20: qty 2

## 2011-03-20 MED ORDER — PHENYLEPHRINE 40 MCG/ML (10ML) SYRINGE FOR IV PUSH (FOR BLOOD PRESSURE SUPPORT)
PREFILLED_SYRINGE | INTRAVENOUS | Status: AC
  Filled 2011-03-20: qty 5

## 2011-03-20 MED ORDER — LANOLIN HYDROUS EX OINT: 1.0000 "application " | TOPICAL_OINTMENT | CUTANEOUS | Status: DC | PRN

## 2011-03-20 MED ORDER — FENTANYL CITRATE 0.05 MG/ML IJ SOLN
INTRAMUSCULAR | Status: DC | PRN
  Administered 2011-03-20: 50 ug via INTRAVENOUS
  Administered 2011-03-20: 35 ug via INTRAVENOUS
  Administered 2011-03-20 (×2): 50 ug via INTRAVENOUS

## 2011-03-20 MED ORDER — OXYCODONE-ACETAMINOPHEN 5-325 MG PO TABS
1.0000 | ORAL_TABLET | ORAL | Status: DC | PRN
  Administered 2011-03-21 (×3): 1 via ORAL
  Administered 2011-03-22: 2 via ORAL
  Administered 2011-03-22 (×4): 1 via ORAL
  Administered 2011-03-23: 2 via ORAL
  Administered 2011-03-23: 1 via ORAL
  Filled 2011-03-20: qty 1
  Filled 2011-03-20: qty 2
  Filled 2011-03-20: qty 1
  Filled 2011-03-20 (×2): qty 2
  Filled 2011-03-20 (×5): qty 1
  Filled 2011-03-20: qty 2

## 2011-03-20 MED ORDER — FENTANYL CITRATE 0.05 MG/ML IJ SOLN
25.0000 ug | INTRAMUSCULAR | Status: DC | PRN
  Administered 2011-03-20 (×2): 50 ug via INTRAVENOUS

## 2011-03-20 MED ORDER — KETOROLAC TROMETHAMINE 60 MG/2ML IM SOLN
INTRAMUSCULAR | Status: AC
  Administered 2011-03-20: 60 mg via INTRAMUSCULAR
  Filled 2011-03-20: qty 2

## 2011-03-20 MED ORDER — FENTANYL CITRATE 0.05 MG/ML IJ SOLN
INTRAMUSCULAR | Status: AC
  Administered 2011-03-20: 50 ug via INTRAVENOUS
  Filled 2011-03-20: qty 2

## 2011-03-20 MED ORDER — WITCH HAZEL-GLYCERIN EX PADS: 1.0000 "application " | MEDICATED_PAD | CUTANEOUS | Status: DC | PRN

## 2011-03-20 MED ORDER — SENNOSIDES-DOCUSATE SODIUM 8.6-50 MG PO TABS
2.0000 | ORAL_TABLET | Freq: Every day | ORAL | Status: DC
  Administered 2011-03-20 – 2011-03-22 (×3): 2 via ORAL

## 2011-03-20 MED ORDER — SIMETHICONE 80 MG PO CHEW
80.0000 mg | CHEWABLE_TABLET | ORAL | Status: DC | PRN
  Administered 2011-03-20 – 2011-03-22 (×2): 80 mg via ORAL

## 2011-03-20 MED ORDER — KETOROLAC TROMETHAMINE 30 MG/ML IJ SOLN: 30.0000 mg | Freq: Four times a day (QID) | INTRAMUSCULAR | Status: DC | PRN

## 2011-03-20 MED ORDER — DIBUCAINE 1 % RE OINT: 1.0000 "application " | TOPICAL_OINTMENT | RECTAL | Status: DC | PRN

## 2011-03-20 MED ORDER — LACTATED RINGERS IV SOLN
INTRAVENOUS | Status: DC
  Administered 2011-03-20: via INTRAVENOUS

## 2011-03-20 MED ORDER — MORPHINE SULFATE 0.5 MG/ML IJ SOLN
INTRAMUSCULAR | Status: AC
  Filled 2011-03-20: qty 10

## 2011-03-20 MED ORDER — BUPIVACAINE IN DEXTROSE 0.75-8.25 % IT SOLN
INTRATHECAL | Status: DC | PRN
  Administered 2011-03-20: 1.5 mL via INTRATHECAL

## 2011-03-20 MED ORDER — PRENATAL PLUS 27-1 MG PO TABS
1.0000 | ORAL_TABLET | Freq: Every day | ORAL | Status: DC
  Administered 2011-03-21 – 2011-03-23 (×3): 1 via ORAL
  Filled 2011-03-20 (×3): qty 1

## 2011-03-20 MED ORDER — MORPHINE SULFATE (PF) 0.5 MG/ML IJ SOLN
INTRAMUSCULAR | Status: DC | PRN
  Administered 2011-03-20: 2.5 ug via EPIDURAL

## 2011-03-20 MED ORDER — IBUPROFEN 600 MG PO TABS: 600.0000 mg | ORAL_TABLET | Freq: Four times a day (QID) | ORAL | Status: DC | PRN

## 2011-03-20 MED ORDER — MAGNESIUM HYDROXIDE 400 MG/5ML PO SUSP: 30.0000 mL | ORAL | Status: DC | PRN

## 2011-03-20 MED ORDER — KETOROLAC TROMETHAMINE 30 MG/ML IJ SOLN
30.0000 mg | Freq: Four times a day (QID) | INTRAMUSCULAR | Status: DC | PRN
  Filled 2011-03-20: qty 1

## 2011-03-20 MED ORDER — SCOPOLAMINE 1 MG/3DAYS TD PT72
MEDICATED_PATCH | TRANSDERMAL | Status: AC
  Administered 2011-03-20: 1.5 mg
  Filled 2011-03-20: qty 1

## 2011-03-20 MED ORDER — OXYTOCIN 20 UNITS IN LACTATED RINGERS INFUSION - SIMPLE
INTRAVENOUS | Status: DC | PRN
  Administered 2011-03-20: 20 [IU] via INTRAVENOUS

## 2011-03-20 MED ORDER — LACTATED RINGERS IV SOLN
INTRAVENOUS | Status: DC
  Administered 2011-03-20 (×3): via INTRAVENOUS

## 2011-03-20 MED ORDER — NALBUPHINE SYRINGE 5 MG/0.5 ML
5.0000 mg | INJECTION | INTRAMUSCULAR | Status: DC | PRN
  Filled 2011-03-20: qty 1

## 2011-03-20 MED ORDER — FENTANYL CITRATE 0.05 MG/ML IJ SOLN
INTRAMUSCULAR | Status: DC | PRN
  Administered 2011-03-20: 15 ug via INTRATHECAL

## 2011-03-20 MED ORDER — MEASLES, MUMPS & RUBELLA VAC ~~LOC~~ INJ
0.5000 mL | INJECTION | Freq: Once | SUBCUTANEOUS | Status: DC
  Filled 2011-03-20: qty 0.5

## 2011-03-20 MED ORDER — SODIUM CHLORIDE 0.9 % IV SOLN
1.0000 ug/kg/h | INTRAVENOUS | Status: DC | PRN
  Filled 2011-03-20: qty 2.5

## 2011-03-20 MED ORDER — DIPHENHYDRAMINE HCL 25 MG PO CAPS
25.0000 mg | ORAL_CAPSULE | Freq: Four times a day (QID) | ORAL | Status: DC | PRN
  Administered 2011-03-20 – 2011-03-21 (×4): 25 mg via ORAL
  Filled 2011-03-20 (×4): qty 1

## 2011-03-20 MED ORDER — OXYTOCIN 10 UNIT/ML IJ SOLN
INTRAMUSCULAR | Status: AC
  Filled 2011-03-20: qty 2

## 2011-03-20 MED ORDER — PHENYLEPHRINE HCL 10 MG/ML IJ SOLN
INTRAMUSCULAR | Status: DC | PRN
  Administered 2011-03-20: 40 ug via INTRAVENOUS
  Administered 2011-03-20 (×2): 80 ug via INTRAVENOUS

## 2011-03-20 MED ORDER — KETOROLAC TROMETHAMINE 60 MG/2ML IM SOLN
60.0000 mg | Freq: Once | INTRAMUSCULAR | Status: AC | PRN
  Administered 2011-03-20: 60 mg via INTRAMUSCULAR
  Filled 2011-03-20: qty 2

## 2011-03-20 MED ORDER — KETOROLAC TROMETHAMINE 30 MG/ML IJ SOLN
30.0000 mg | Freq: Once | INTRAMUSCULAR | Status: AC
  Administered 2011-03-20: 30 mg via INTRAVENOUS

## 2011-03-20 MED ORDER — MIDAZOLAM HCL 5 MG/5ML IJ SOLN
INTRAMUSCULAR | Status: DC | PRN
  Administered 2011-03-20: 2 mg via INTRAVENOUS

## 2011-03-20 SURGICAL SUPPLY — 40 items
CANISTER WOUND CARE 500ML ATS (WOUND CARE) IMPLANT
CHLORAPREP W/TINT 26ML (MISCELLANEOUS) ×2 IMPLANT
CLOTH BEACON ORANGE TIMEOUT ST (SAFETY) ×2 IMPLANT
CONTAINER PREFILL 10% NBF 15ML (MISCELLANEOUS) IMPLANT
DERMABOND ADVANCED (GAUZE/BANDAGES/DRESSINGS) ×1
DERMABOND ADVANCED .7 DNX12 (GAUZE/BANDAGES/DRESSINGS) ×1 IMPLANT
DRESSING TELFA 8X3 (GAUZE/BANDAGES/DRESSINGS) ×2 IMPLANT
DRSG VAC ATS LRG SENSATRAC (GAUZE/BANDAGES/DRESSINGS) IMPLANT
DRSG VAC ATS MED SENSATRAC (GAUZE/BANDAGES/DRESSINGS) IMPLANT
DRSG VAC ATS SM SENSATRAC (GAUZE/BANDAGES/DRESSINGS) IMPLANT
ELECT REM PT RETURN 9FT ADLT (ELECTROSURGICAL) ×2
ELECTRODE REM PT RTRN 9FT ADLT (ELECTROSURGICAL) ×1 IMPLANT
EXTRACTOR VACUUM M CUP 4 TUBE (SUCTIONS) IMPLANT
GAUZE SPONGE 4X4 12PLY STRL LF (GAUZE/BANDAGES/DRESSINGS) ×4 IMPLANT
GLOVE BIO SURGEON STRL SZ 6.5 (GLOVE) ×4 IMPLANT
GOWN PREVENTION PLUS LG XLONG (DISPOSABLE) ×6 IMPLANT
KIT ABG SYR 3ML LUER SLIP (SYRINGE) IMPLANT
NEEDLE HYPO 25X5/8 SAFETYGLIDE (NEEDLE) ×2 IMPLANT
NS IRRIG 1000ML POUR BTL (IV SOLUTION) ×2 IMPLANT
PACK C SECTION WH (CUSTOM PROCEDURE TRAY) ×2 IMPLANT
PAD ABD 7.5X8 STRL (GAUZE/BANDAGES/DRESSINGS) ×2 IMPLANT
RTRCTR C-SECT PINK 25CM LRG (MISCELLANEOUS) IMPLANT
RTRCTR C-SECT PINK 34CM XLRG (MISCELLANEOUS) IMPLANT
SLEEVE SCD COMPRESS KNEE MED (MISCELLANEOUS) IMPLANT
STAPLER VISISTAT 35W (STAPLE) IMPLANT
SUT MNCRL 0 VIOLET CTX 36 (SUTURE) ×2 IMPLANT
SUT MNCRL AB 3-0 PS2 27 (SUTURE) IMPLANT
SUT MONOCRYL 0 CTX 36 (SUTURE) ×2
SUT PDS AB 0 CTX 36 PDP370T (SUTURE) ×2 IMPLANT
SUT PLAIN 0 NONE (SUTURE) IMPLANT
SUT VIC AB 0 CT1 27 (SUTURE)
SUT VIC AB 0 CT1 27XBRD ANBCTR (SUTURE) IMPLANT
SUT VIC AB 2-0 CT1 (SUTURE) IMPLANT
SUT VIC AB 2-0 CT1 27 (SUTURE) ×1
SUT VIC AB 2-0 CT1 TAPERPNT 27 (SUTURE) ×1 IMPLANT
SUT VIC AB 2-0 SH 27 (SUTURE)
SUT VIC AB 2-0 SH 27XBRD (SUTURE) IMPLANT
TOWEL OR 17X24 6PK STRL BLUE (TOWEL DISPOSABLE) ×4 IMPLANT
TRAY FOLEY CATH 14FR (SET/KITS/TRAYS/PACK) ×2 IMPLANT
WATER STERILE IRR 1000ML POUR (IV SOLUTION) ×2 IMPLANT

## 2011-03-20 NOTE — Op Note (Signed)
Cesarean Section Procedure Note   Michelle [name]   03/20/2011    Pre-operative Diagnosis: Previous Cesarian Section; Desires Sterilization.   Post-operative Diagnosis: Same   Surgeon: Antionette Char A  Assistants: none  Anesthesia: spinal  Procedure Details:  The patient was seen in the Holding Room. The risks, benefits, complications, treatment options, and expected outcomes were discussed with the patient. The patient concurred with the proposed plan, giving informed consent. The patient was identified as Michelle [name] and the procedure verified as C-Section Delivery. A Time Out was held and the above information confirmed.  After induction of anesthesia, the patient was draped and prepped in the usual sterile manner. A transverse incision was made and carried down through the subcutaneous tissue to the fascia. The fascial incision was made and extended transversely. The fascia was separated from the underlying rectus tissue superiorly and inferiorly. The peritoneum was identified and entered. The peritoneal incision was extended longitudinally. The utero-vesical peritoneal reflection was incised transversely and the bladder flap was bluntly freed from the lower uterine segment. A low transverse uterine incision was made. Delivered from cephalic presentation was a living newborn female infant. A cord ph was not sent. The umbilical cord was clamped and cut cord. A sample was obtained for evaluation. The placenta was removed Intact and appeared normal.  The uterine incision was closed with running locked sutures of 1-0 Monocryl. A second imbricating layer of the same suture was placed.  Hemostasis was observed. The left Fallopian tube was identified and followed out to the fimbriated end.  The mid isthmic portion of the tube was grasped with a Babcock clamp.  A 1-2 cm segment of tube was doubly ligated with 0-Plain suture and excised.  Adequate hemostasis was noted.  The right  Fallopian tube was manipulated in a similar fashion. The paracolic gutters were irrigated. The fascia was then reapproximated with running sutures of 1-0 PDS. The subcuticular closure was performed using 3-0 Monocryl.  Instrument, sponge, and needle counts were correct prior the abdominal closure and were correct at the conclusion of the case.    Findings: Omental adhesions, filmy   Estimated Blood Loss: 600 ml  Total IV Fluids: per Anesthesiology   Urine Output: per Anesthesiology  Specimens: Portions of Fallopian tubes  Complications: no complications  Disposition: PACU - hemodynamically stable.  Maternal Condition: stable   Baby condition / location:  nursery-stable    Signed: Surgeon(s): [name], MD

## 2011-03-20 NOTE — Anesthesia Postprocedure Evaluation (Signed)
Anesthesia Post Note  Patient: Michelle [name]  Procedure(s) Performed:  CESAREAN SECTION WITH BILATERAL TUBAL LIGATION - Repeat Cesarian Section with Bilateral Tubal Ligations  Anesthesia type: Spinal  Patient location: PACU  Post pain: Pain level controlled  Post assessment: Post-op Vital signs reviewed  Last Vitals:  Filed Vitals:   03/20/11 1445  BP:   Pulse: 75  Temp:   Resp:     Post vital signs: Reviewed  Level of consciousness: awake  Complications: No apparent anesthesia complications

## 2011-03-20 NOTE — Anesthesia Preprocedure Evaluation (Signed)
Anesthesia Evaluation  Patient identified by MRN, date of birth, ID band Patient awake    Reviewed: Allergy & Precautions, H&P , NPO status , Patient's Chart, lab work & pertinent test results, reviewed documented beta blocker date and time   History of Anesthesia Complications Negative for: history of anesthetic complications  Airway Mallampati: II TM Distance: >3 FB Neck ROM: full    Dental  (+) Teeth Intact   Pulmonary Current Smoker (1 ppd),  clear to auscultation        Cardiovascular neg cardio ROS regular Normal    Neuro/Psych PSYCHIATRIC DISORDERS (bipolar, ADD) Negative Neurological ROS     GI/Hepatic negative GI ROS, Neg liver ROS,   Endo/Other  Negative Endocrine ROS  Renal/GU negative Renal ROS  Genitourinary negative   Musculoskeletal   Abdominal   Peds  Hematology negative hematology ROS (+)   Anesthesia Other Findings   Reproductive/Obstetrics (+) Pregnancy (h/o c/s x2)                           Anesthesia Physical Anesthesia Plan  ASA: II  Anesthesia Plan: Spinal   Post-op Pain Management:    Induction:   Airway Management Planned:   Additional Equipment:   Intra-op Plan:   Post-operative Plan:   Informed Consent: I have reviewed the patients History and Physical, chart, labs and discussed the procedure including the risks, benefits and alternatives for the proposed anesthesia with the patient or authorized representative who has indicated his/her understanding and acceptance.   Dental Advisory Given  Plan Discussed with: CRNA and Surgeon  Anesthesia Plan Comments:         Anesthesia Quick Evaluation

## 2011-03-20 NOTE — Transfer of Care (Signed)
Immediate Anesthesia Transfer of Care Note  Patient: Michelle [name]  Procedure(s) Performed:  CESAREAN SECTION WITH BILATERAL TUBAL LIGATION - Repeat Cesarian Section with Bilateral Tubal Ligations  Patient Location: PACU  Anesthesia Type: Spinal  Level of Consciousness: sedated  Airway & Oxygen Therapy: Patient Spontanous Breathing  Post-op Assessment: Report given to PACU RN  Post vital signs: Reviewed and stable  Complications: No apparent anesthesia complications

## 2011-03-20 NOTE — H&P (Signed)
Michelle [name] is a 30 y.o. female presenting for a repeat cesarean delivery and BTL. Maternal Medical History:  Reason for admission: Reason for Admission:   nauseaFor elective repeat cesarean delivery  Fetal activity: Perceived fetal activity is normal.    Prenatal complications: Substance abuse.     OB History    Grav Para Term Preterm Abortions TAB SAB Ect Mult Living   4 2 2  0 1 0 1 0 0 2     Past Medical History  Diagnosis Date  . Depression     not on meds while pregnant  . Bipolar 1 disorder, depressed     pt feels stable- not on meds while pregnant  . ADHD (attention deficit hyperactivity disorder)    Past Surgical History  Procedure Date  . Cesarean section     x 2  . Bunionectomy   . Tonsillectomy   . Dental surgery    Family History: family history is not on file. Social History:  reports that she has been smoking Cigarettes.  She has been smoking about 1 pack per day. She does not have any smokeless tobacco history on file. She reports that she does not drink alcohol or use illicit drugs.  Review of Systems  Constitutional: Negative for fever.  Eyes: Negative for blurred vision.  Respiratory: Negative for shortness of breath.   Gastrointestinal: Negative for nausea and vomiting.  Skin: Negative for rash.  Neurological: Negative for headaches.      Last menstrual period 06/15/2010. Maternal Exam:  Abdomen: Patient reports no abdominal tenderness. Surgical scars: low transverse.   Fetal presentation: vertex  Introitus: not evaluated.     Fetal Exam Fetal Monitor Review: Mode: hand-held doppler probe.       Physical Exam  Prenatal labs: ABO, Rh: A/Positive/-- (03/22 0000) Antibody: Negative (03/22 0000) Rubella: Immune (03/22 0000) RPR: NON REACTIVE (11/13 1232)  HBsAg: Negative (03/22 0000)  HIV: Non-reactive (03/22 0000)  GBS:     Assessment/Plan: 30 y.o. with an intrauterine pregnancy at [redacted]w[redacted]d.  H/O 2 previous cesarean  deliveries.  Desires sterilization.  Admit Repeat cesarean delivery/BTL   JACKSON-MOORE,Levon Penning A 03/20/2011, 6:22 AM

## 2011-03-20 NOTE — Anesthesia Procedure Notes (Signed)
Spinal  Patient location during procedure: OR Start time: 03/20/2011 1:03 PM Staffing Performed by: anesthesiologist  Preanesthetic Checklist Completed: patient identified, site marked, surgical consent, pre-op evaluation, timeout performed, IV checked, risks and benefits discussed and monitors and equipment checked Spinal Block Patient position: sitting Prep: site prepped and draped and DuraPrep Patient monitoring: heart rate, cardiac monitor, continuous pulse ox and blood pressure Approach: midline Location: L3-4 Injection technique: single-shot Needle Needle type: Sprotte  Needle gauge: 24 G Needle length: 9 cm Assessment Sensory level: T4 Additional Notes Clear free flow CSF on first attempt.  Jasmine December, MD

## 2011-03-21 MED ORDER — PNEUMOCOCCAL VAC POLYVALENT 25 MCG/0.5ML IJ INJ
0.5000 mL | INJECTION | INTRAMUSCULAR | Status: AC
  Administered 2011-03-22: 0.5 mL via INTRAMUSCULAR
  Filled 2011-03-21: qty 0.5

## 2011-03-21 MED ORDER — INFLUENZA VIRUS VACC SPLIT PF IM SUSP
0.5000 mL | INTRAMUSCULAR | Status: AC
  Administered 2011-03-22: 0.5 mL via INTRAMUSCULAR
  Filled 2011-03-21: qty 0.5

## 2011-03-21 NOTE — Addendum Note (Signed)
Addendum  created 03/21/11 0853 by Suella Grove   Modules edited:Notes Section

## 2011-03-21 NOTE — Progress Notes (Signed)
UR Chart review completed.  

## 2011-03-21 NOTE — Anesthesia Postprocedure Evaluation (Signed)
  Anesthesia Post-op Note  Patient: Michelle [name]  Procedure(s) Performed:  CESAREAN SECTION WITH BILATERAL TUBAL LIGATION - Repeat Cesarian Section with Bilateral Tubal Ligations  Patient Location: Mother/Baby  Anesthesia Type: Spinal  Level of Consciousness: awake  Airway and Oxygen Therapy: Patient Spontanous Breathing  Post-op Pain: none  Post-op Assessment: Post-op Vital signs reviewed  Post-op Vital Signs: Reviewed and stable  Complications: No apparent anesthesia complications
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
## 2011-03-21 NOTE — Progress Notes (Signed)
  Subjective: POD# 1 s/p Cesarean Delivery.  Indications: elective repeat  RH status/Rubella reviewed. Feeding: unknown Patient reports tolerating PO.  Denies HA/SOB/C/P/N/V/dizziness.  Reports flatus or BM. Breast symptoms: no.  She reports vaginal bleeding as normal, without clots.  She is ambulating, urinating without difficulty.     Objective: Vital signs in last 24 hours: BP 102/59  Pulse 78  Temp(Src) 98.4 F (36.9 C) (Oral)  Resp 18  Wt 95.709 kg (211 lb)  SpO2 97%  LMP 06/15/2010  Breastfeeding? Unknown       Physical Exam:  General: alert CV: Regular rate and rhythm Resp: clear Abdomen: soft, nontender, normal bowel sounds Lochia: minimal Uterine Fundus: firm, below umbilicus, tender Incision: dry, intact and scant dried heme Ext: extremities normal, atraumatic, no cyanosis or edema    Basename 03/21/11 0530 03/19/11 1232  HGB 11.9* 13.8  HCT -- 41.5      Assessment/Plan: 30 y.o.  status post Cesarean section. POD# 1.   Doing well, stable.              Advance diet as tolerated Start po pain meds D/C foley  HLIV  Ambulate IS Social services consult Routine post-op care  JACKSON-MOORE,Haston Casebolt A 03/21/2011, 11:37 AM

## 2011-03-22 NOTE — Progress Notes (Signed)
Subjective: Postpartum Day 3: Cesarean Delivery Patient reports tolerating PO, + flatus and no problems voiding.    Objective: Vital signs in last 24 hours: Temp:  [98.2 F (36.8 C)-98.7 F (37.1 C)] 98.2 F (36.8 C) (11/16 0553) Pulse Rate:  [72-84] 72  (11/16 0553) Resp:  [18-20] 18  (11/16 0553) BP: (93-126)/(59-76) 93/64 mmHg (11/16 0553) SpO2:  [96 %] 96 % (11/15 2138)  Physical Exam:  General: alert, cooperative, distracted, no distress and concerned due to social work and CPS involvement. Pt has history of MJ use, bipolar and mental instability. Currently living with godmother, for last few months of pregnancy and desires to parent this baby. Pt requesting UDS to be done to prove that she is drug-free at this time. Reminded patient that she has been taking narcotic pain relievers and would test positive for same.  Lochia: appropriate Uterine Fundus: firm Incision: healing well, no significant drainage, no dehiscence, no significant erythema DVT Evaluation: No evidence of DVT seen on physical exam. Negative Homan's sign. No cords or calf tenderness. No significant calf/ankle edema.   Basename 03/21/11 0530 03/19/11 1232  HGB 11.9* 13.8  HCT -- 41.5    Assessment/Plan: Status post Cesarean section. Doing well postoperatively.  Plan for discharge tomorrow, Team meeting today.  Anice Paganini CNM 03/22/2011, 8:06 AM

## 2011-03-23 MED ORDER — IBUPROFEN 600 MG PO TABS: 600.0000 mg | ORAL_TABLET | Freq: Four times a day (QID) | ORAL | Status: AC

## 2011-03-23 MED ORDER — OXYCODONE-ACETAMINOPHEN 5-325 MG PO TABS: 1.0000 | ORAL_TABLET | ORAL | Status: AC | PRN

## 2011-03-23 NOTE — Progress Notes (Signed)
Subjective: POD #3 s/p LTC/S  Indication:elective repeat Patient reports tolerating PO.  Denies HA/SOB/C/P/N/V/dizziness.  Reports flatus or BM. Breast symptoms: no  She reports vaginal bleeding as normal, without clots.  She is ambulating, urinating without difficulty.     Objective: Vital signs in last 24 hours: BP 92/65  Pulse 72  Temp(Src) 97.5 F (36.4 C) (Oral)  Resp 18  Wt 95.709 kg (211 lb)  SpO2 96%  LMP 06/15/2010  Breastfeeding? Unknown  Physical Exam:  General: alert CV: Regular rate and rhythm Resp: clear Abdomen: soft, nontender, normal bowel sounds Lochia: minimal Uterine Fundus: firm, below umbilicus, nontender Incision: clean, dry and intact Ext: extremities normal, atraumatic, no cyanosis or edema  Basename 03/21/11 0530  HGB 11.9*  HCT --    Assessment/Plan: 30 y.o. status post Cesarean section POD# 3.  normal post-operative exam  Routine post-op care D/C home  JACKSON-MOORE,Juwan Vences A 03/23/2011, 2:35 PM

## 2011-03-23 NOTE — Progress Notes (Signed)
Received call from staff indicating patient had a question she wanted to speak with me about.  Visited mom in room and she wanted to know how to go about changing name on birth certificate.  I advised I would direct call to [employer].  Contacted [employer] cabin crew and she has received message and will contact mom regarding her request.  Updated mom about findings.    Staci Acosta, LCSW, 03/23/2011, 9:08 am

## 2011-03-23 NOTE — Discharge Summary (Signed)
Obstetric Discharge Summary Reason for Admission: cesarean section Prenatal Procedures: none Intrapartum Procedures: cesarean: low cervical, transverse and tubal ligation Postpartum Procedures: none Complications-Operative and Postpartum: none Hemoglobin  Date Value Range Status  03/21/2011 11.9* 12.0-15.0 (g/dL) Final     HCT  Date Value Range Status  03/19/2011 41.5  36.0-46.0 (%) Final    Discharge Diagnoses: Term Pregnancy-delivered  Discharge Information: Date: 03/23/2011 Activity: pelvic rest Diet: routine Medications: Ibuprofen and Percocet Condition: stable Instructions: See above Discharge to: home Follow-up Information    Follow up with [name], MD. Make an appointment in 2 weeks.   Contact information:   [address], [address] [place] [address] [phone]          Newborn Data: Live born female  Birth Weight: 5 lb 15.9 oz (2719 g) APGAR: 9, 9  Home with mother.  [name],Michelle Sporer A 03/23/2011, 2:42 PM

## 2011-11-14 ENCOUNTER — Ambulatory Visit: Payer: Medicaid Other | Admitting: Endocrinology

## 2011-11-14 DIAGNOSIS — Z0289 Encounter for other administrative examinations: Secondary | ICD-10-CM

## 2012-10-14 ENCOUNTER — Inpatient Hospital Stay (HOSPITAL_COMMUNITY)
Admission: AD | Admit: 2012-10-14 | Discharge: 2012-10-14 | Disposition: A | Discharge status: Home / Self Care | Payer: Medicaid Other | Source: Ambulatory Visit | Attending: Obstetrics & Gynecology | Admitting: Obstetrics & Gynecology

## 2012-10-14 ENCOUNTER — Encounter (HOSPITAL_COMMUNITY): Payer: Self-pay | Admitting: *Deleted

## 2012-10-14 DIAGNOSIS — B9689 Other specified bacterial agents as the cause of diseases classified elsewhere: Secondary | ICD-10-CM

## 2012-10-14 DIAGNOSIS — R300 Dysuria: Secondary | ICD-10-CM | POA: Insufficient documentation

## 2012-10-14 DIAGNOSIS — A499 Bacterial infection, unspecified: Secondary | ICD-10-CM | POA: Insufficient documentation

## 2012-10-14 DIAGNOSIS — N760 Acute vaginitis: Secondary | ICD-10-CM

## 2012-10-14 DIAGNOSIS — L293 Anogenital pruritus, unspecified: Secondary | ICD-10-CM | POA: Insufficient documentation

## 2012-10-14 LAB — URINALYSIS, ROUTINE W REFLEX MICROSCOPIC
Leukocytes, UA: NEGATIVE
Specific Gravity, Urine: >1.03 — ABNORMAL HIGH (ref 1.005–1.030)
Urobilinogen, UA: 0.2 mg/dL (ref 0.0–1.0)

## 2012-10-14 LAB — POCT PREGNANCY, URINE: Preg Test, Ur: NEGATIVE

## 2012-10-14 LAB — URINE MICROSCOPIC-ADD ON

## 2012-10-14 LAB — WET PREP, GENITAL

## 2012-10-14 MED ORDER — METRONIDAZOLE 500 MG PO TABS
500.0000 mg | ORAL_TABLET | Freq: Once | ORAL | Status: AC
  Administered 2012-10-14: 500 mg via ORAL
  Filled 2012-10-14: qty 1

## 2012-10-14 MED ORDER — METRONIDAZOLE 500 MG PO TABS: 500.0000 mg | ORAL_TABLET | Freq: Once | ORAL | Status: DC

## 2012-10-14 MED ORDER — CEPHALEXIN 500 MG PO CAPS
500.0000 mg | ORAL_CAPSULE | Freq: Once | ORAL | Status: AC
  Administered 2012-10-14: 500 mg via ORAL
  Filled 2012-10-14: qty 1

## 2012-10-14 MED ORDER — ONDANSETRON HCL 4 MG PO TABS
4.0000 mg | ORAL_TABLET | Freq: Once | ORAL | Status: AC
  Administered 2012-10-14: 4 mg via ORAL
  Filled 2012-10-14: qty 1

## 2012-10-14 NOTE — MAU Provider Note (Signed)
Chief Complaint: Vaginal Itching   First Provider Initiated Contact with Patient 10/14/12 2212     SUBJECTIVE HPI: Michelle [name] is a 32 y.o. [id number] at Unknown by LMP who presents with burning w/ urination and vaginal itching since this evening. Denies fever, chills, urgency, frequency, hematuria, flank pain. Unsure if the burning w/ urination is in her urethra or external.   Past Medical History  Diagnosis Date  . Depression     not on meds while pregnant  . Bipolar 1 disorder, depressed     pt feels stable- not on meds while pregnant  . ADHD (attention deficit hyperactivity disorder)    OB History   Grav Para Term Preterm Abortions TAB SAB Ect Mult Living   4 3 3  0 1 0 1 0 0 3     # Outc Date GA Lbr Len/2nd Wgt Sex Del Anes PTL Lv   1 TRM 11/12 [redacted]w[redacted]d 00:00 2.719kg(5lb15.9oz) M LTCS Spinal     2 TRM            3 TRM            4 SAB              Past Surgical History  Procedure Laterality Date  . Cesarean section      x 2  . Bunionectomy    . Tonsillectomy    . Dental surgery    . Tubal ligation     History   Social History  . Marital Status: Single    Spouse Name: N/A    Number of Children: N/A  . Years of Education: N/A   Occupational History  . Not on file.   Social History Main Topics  . Smoking status: Current Every Day Smoker -- 1.00 packs/day    Types: Cigarettes  . Smokeless tobacco: Not on file  . Alcohol Use: No  . Drug Use: No  . Sexually Active: Yes    Birth Control/ Protection: Surgical   Other Topics Concern  . Not on file   Social History Narrative  . No narrative on file   No current facility-administered medications on file prior to encounter.   No current outpatient prescriptions on file prior to encounter.   No Known Allergies  ROS: Pertinent items in HPI  OBJECTIVE Blood pressure 124/69, pulse 85, temperature 98.6 F (37 C), temperature source Oral, resp. rate 18, height 5\' 3"  (1.6 m), weight 74.844 kg (165 lb), last  menstrual period 10/11/2012, SpO2 100.00%. GENERAL: Well-developed, well-nourished female in no acute distress.  HEENT: Normocephalic HEART: normal rate RESP: normal effort ABDOMEN: Soft, non-tender except for ? umbilical hernia. No pain w/out palpation.  BACK: No  EXTREMITIES: Nontender, no edema NEURO: Alert and oriented SPECULUM EXAM: NEFG, small amount of dark red  blood noted, cervix clean. BIMANUAL: cervix closed; uterus normal size, no adnexal tenderness or masses. No CMT.   LAB RESULTS Results for orders placed during the hospital encounter of 10/14/12 (from the past 24 hour(s))  URINALYSIS, ROUTINE W REFLEX MICROSCOPIC     Status: Abnormal   Collection Time    10/14/12  8:48 PM      Result Value Range   Color, Urine YELLOW  YELLOW   APPearance CLEAR  CLEAR   Specific Gravity, Urine >1.030 (*) 1.005 - 1.030   pH 5.5  5.0 - 8.0   Glucose, UA NEGATIVE  NEGATIVE mg/dL   Hgb urine dipstick LARGE (*) NEGATIVE   Bilirubin Urine NEGATIVE  NEGATIVE   Ketones, ur NEGATIVE  NEGATIVE mg/dL   Protein, ur NEGATIVE  NEGATIVE mg/dL   Urobilinogen, UA 0.2  0.0 - 1.0 mg/dL   Nitrite NEGATIVE  NEGATIVE   Leukocytes, UA NEGATIVE  NEGATIVE  URINE MICROSCOPIC-ADD ON     Status: Abnormal   Collection Time    10/14/12  8:48 PM      Result Value Range   Squamous Epithelial / LPF FEW (*) RARE   WBC, UA 0-2  <3 WBC/hpf   RBC / HPF 3-6  <3 RBC/hpf   Bacteria, UA FEW (*) RARE  POCT PREGNANCY, URINE     Status: None   Collection Time    10/14/12  9:37 PM      Result Value Range   Preg Test, Ur NEGATIVE  NEGATIVE  WET PREP, GENITAL     Status: Abnormal   Collection Time    10/14/12 10:05 PM      Result Value Range   Yeast Wet Prep HPF POC NONE SEEN  NONE SEEN   Trich, Wet Prep NONE SEEN  NONE SEEN   Clue Cells Wet Prep HPF POC FEW (*) NONE SEEN   WBC, Wet Prep HPF POC FEW (*) NONE SEEN    IMAGING No results found.  MAU COURSE  ASSESSMENT 1. BV (bacterial vaginosis)   2. Dysuria      PLAN Discharge home in stable condition. Urine culture and GC/CT pending.      Follow-up Information   Follow up with [name], MD On 10/28/2012. (as scheduled or as needed if symptoms worsen)    Contact information:   [address] [place] [address] [phone]        Medication List    TAKE these medications       metroNIDAZOLE 500 MG tablet  Commonly known as:  FLAGYL  Take 1 tablet (500 mg total) by mouth once.       Dorathy Kinsman, CNM 10/15/2012  12:00 AM

## 2012-10-14 NOTE — MAU Note (Signed)
Pt reports burning with urination and vaginal itching today.

## 2012-10-15 LAB — GC/CHLAMYDIA PROBE AMP
CT Probe RNA: NEGATIVE
GC Probe RNA: NEGATIVE

## 2012-10-28 ENCOUNTER — Ambulatory Visit: Payer: Self-pay | Admitting: Obstetrics & Gynecology

## 2012-12-11 ENCOUNTER — Encounter: Payer: Self-pay | Admitting: Obstetrics & Gynecology

## 2012-12-25 ENCOUNTER — Encounter (HOSPITAL_COMMUNITY): Payer: Self-pay

## 2012-12-25 ENCOUNTER — Emergency Department (HOSPITAL_COMMUNITY)
Admission: EM | Admit: 2012-12-25 | Discharge: 2012-12-25 | Disposition: A | Discharge status: Home / Self Care | Payer: Medicaid Other | Attending: Emergency Medicine | Admitting: Emergency Medicine

## 2012-12-25 ENCOUNTER — Emergency Department (HOSPITAL_COMMUNITY): Payer: Medicaid Other

## 2012-12-25 ENCOUNTER — Emergency Department (HOSPITAL_COMMUNITY)
Admission: EM | Admit: 2012-12-25 | Discharge: 2012-12-25 | Disposition: A | Discharge status: Home / Self Care | Payer: Medicaid Other | Source: Home / Self Care

## 2012-12-25 DIAGNOSIS — F17200 Nicotine dependence, unspecified, uncomplicated: Secondary | ICD-10-CM | POA: Insufficient documentation

## 2012-12-25 DIAGNOSIS — Y9389 Activity, other specified: Secondary | ICD-10-CM | POA: Insufficient documentation

## 2012-12-25 DIAGNOSIS — S62329A Displaced fracture of shaft of unspecified metacarpal bone, initial encounter for closed fracture: Secondary | ICD-10-CM

## 2012-12-25 DIAGNOSIS — Y929 Unspecified place or not applicable: Secondary | ICD-10-CM | POA: Insufficient documentation

## 2012-12-25 DIAGNOSIS — Z8659 Personal history of other mental and behavioral disorders: Secondary | ICD-10-CM | POA: Insufficient documentation

## 2012-12-25 MED ORDER — OXYCODONE-ACETAMINOPHEN 5-325 MG PO TABS: 1.0000 | ORAL_TABLET | ORAL | Status: DC | PRN

## 2012-12-25 MED ORDER — OXYCODONE-ACETAMINOPHEN 5-325 MG PO TABS
2.0000 | ORAL_TABLET | Freq: Once | ORAL | Status: AC
  Administered 2012-12-25: 2 via ORAL
  Filled 2012-12-25: qty 2
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
## 2012-12-25 NOTE — Progress Notes (Signed)
Orthopedic Tech Progress Note Patient Details:  Michelle [name] January 31, 1981 [id number]  Ortho Devices Type of Ortho Device: Ace wrap;Ulna gutter splint Ortho Device/Splint Location: right hand Ortho Device/Splint Interventions: Application   Nikki Dom 12/25/2012, 6:17 PM
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
## 2012-12-25 NOTE — ED Provider Notes (Signed)
Medical screening examination/treatment/procedure(s) were performed by non-physician practitioner and as supervising physician I was immediately available for consultation/collaboration.  Ernan Runkles M Addis Bennie, MD 12/25/12 2306 

## 2012-12-28 ENCOUNTER — Emergency Department (HOSPITAL_COMMUNITY)
Admission: EM | Admit: 2012-12-28 | Discharge: 2012-12-28 | Discharge status: Against Medical Advice | Payer: Medicaid Other | Attending: Emergency Medicine | Admitting: Emergency Medicine

## 2012-12-28 ENCOUNTER — Encounter (HOSPITAL_COMMUNITY): Payer: Self-pay | Admitting: Emergency Medicine

## 2012-12-28 DIAGNOSIS — F3130 Bipolar disorder, current episode depressed, mild or moderate severity, unspecified: Secondary | ICD-10-CM | POA: Insufficient documentation

## 2012-12-28 DIAGNOSIS — F909 Attention-deficit hyperactivity disorder, unspecified type: Secondary | ICD-10-CM | POA: Insufficient documentation

## 2012-12-28 DIAGNOSIS — M79609 Pain in unspecified limb: Secondary | ICD-10-CM | POA: Insufficient documentation

## 2012-12-28 DIAGNOSIS — F17200 Nicotine dependence, unspecified, uncomplicated: Secondary | ICD-10-CM | POA: Insufficient documentation
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
## 2012-12-29 ENCOUNTER — Encounter (HOSPITAL_COMMUNITY): Payer: Self-pay | Admitting: Emergency Medicine

## 2012-12-29 ENCOUNTER — Emergency Department (INDEPENDENT_AMBULATORY_CARE_PROVIDER_SITE_OTHER)
Admission: EM | Admit: 2012-12-29 | Discharge: 2012-12-29 | Disposition: A | Discharge status: Home / Self Care | Payer: Medicaid Other | Source: Home / Self Care

## 2012-12-29 ENCOUNTER — Emergency Department (HOSPITAL_COMMUNITY)
Admission: EM | Admit: 2012-12-29 | Discharge: 2012-12-29 | Discharge status: Against Medical Advice | Payer: Medicaid Other | Attending: Emergency Medicine | Admitting: Emergency Medicine

## 2012-12-29 ENCOUNTER — Emergency Department (INDEPENDENT_AMBULATORY_CARE_PROVIDER_SITE_OTHER): Payer: Medicaid Other

## 2012-12-29 ENCOUNTER — Encounter (HOSPITAL_COMMUNITY): Payer: Self-pay | Admitting: *Deleted

## 2012-12-29 DIAGNOSIS — S62309S Unspecified fracture of unspecified metacarpal bone, sequela: Secondary | ICD-10-CM

## 2012-12-29 DIAGNOSIS — IMO0001 Reserved for inherently not codable concepts without codable children: Secondary | ICD-10-CM

## 2012-12-29 DIAGNOSIS — S62309A Unspecified fracture of unspecified metacarpal bone, initial encounter for closed fracture: Secondary | ICD-10-CM

## 2012-12-29 DIAGNOSIS — M25549 Pain in joints of unspecified hand: Secondary | ICD-10-CM | POA: Insufficient documentation

## 2012-12-29 DIAGNOSIS — M7989 Other specified soft tissue disorders: Secondary | ICD-10-CM | POA: Insufficient documentation

## 2012-12-29 DIAGNOSIS — S62308D Unspecified fracture of other metacarpal bone, subsequent encounter for fracture with routine healing: Secondary | ICD-10-CM

## 2012-12-29 DIAGNOSIS — F3130 Bipolar disorder, current episode depressed, mild or moderate severity, unspecified: Secondary | ICD-10-CM | POA: Insufficient documentation

## 2012-12-29 DIAGNOSIS — F17200 Nicotine dependence, unspecified, uncomplicated: Secondary | ICD-10-CM | POA: Insufficient documentation

## 2012-12-29 DIAGNOSIS — F909 Attention-deficit hyperactivity disorder, unspecified type: Secondary | ICD-10-CM | POA: Insufficient documentation

## 2012-12-29 MED ORDER — OXYCODONE-ACETAMINOPHEN 5-325 MG PO TABS: 1.0000 | ORAL_TABLET | Freq: Three times a day (TID) | ORAL | Status: DC | PRN

## 2012-12-29 MED ORDER — OXYCODONE-ACETAMINOPHEN 5-325 MG PO TABS: 1.0000 | ORAL_TABLET | ORAL | Status: DC | PRN

## 2012-12-29 NOTE — ED Notes (Signed)
Pt left the department and refused to stop to discuss leaving.

## 2012-12-29 NOTE — ED Provider Notes (Signed)
Michelle [name] is a 32 y.o. female who presents to Urgent Care today for right third metacarpal fracture. Patient suffered a hand injury on Thursday, August 21. She was in the emergency room on Friday the 22nd when she was diagnosed with a hand fracture. She was placed into ulnar gutter splint with the intention of following up with Dr. Izora Ribas in about one week. However the splint was very uncomfortable and she developed numbness at the distal fingers. She followed back up at the emergency room yesterday but was asked to come back later as they were quite busy. She presented back to the emergency room at about 3:00 in the morning on August 26.  She became frustrated with waiting and remove the splint herself and now presents to urgent care for further evaluation and management. She notes moderate hand pain but feels well otherwise. She notes a rotational defect of her hand.  She has not scheduled an appointment with Dr. Izora Ribas yet.    PMH reviewed. Bipolar disorder History  Substance Use Topics  . Smoking status: Current Every Day Smoker -- 1.00 packs/day    Types: Cigarettes  . Smokeless tobacco: Not on file  . Alcohol Use: No   ROS as above Medications reviewed. No current facility-administered medications for this encounter.   Current Outpatient Prescriptions  Medication Sig Dispense Refill  . oxyCODONE-acetaminophen (PERCOCET/ROXICET) 5-325 MG per tablet Take 1-2 tablets by mouth every 8 (eight) hours as needed for pain.  15 tablet  0    Exam:  BP 121/93  Pulse 95  Temp(Src) 98.4 F (36.9 C) (Oral)  Resp 16  SpO2 100%  LMP 12/04/2012 Gen: Well NAD RIGHT HAND: Swollen over the metacarpals. Tender palpation of the third metacarpal.   Sensation capillary refill are intact distally  No results found for this or any previous visit (from the past 24 hour(s)). Dg Hand Complete Right  12/29/2012   *RADIOLOGY REPORT*  Clinical Data: Evaluate displacement of the third metacarpal  fracture.  The patient is out of splint.  RIGHT HAND - COMPLETE 3+ VIEW  Comparison: Right hand radiographs 12/26/2002  Findings: There is a complete, obliquely-oriented fracture of the proximal to mid aspect of the third metacarpal.  The distal fracture fragment is displaced posteriorly by approximately 3 mm. There is slight overlap of fracture fragments.  Alignment of the fracture fragments appears without significant change compared 12/25/2012.  The joints are aligned.  No additional bony abnormality is identified.  IMPRESSION: Stable alignment of third metacarpal fracture.  Approximately 3 mm posterior displacement of the distal fracture fragment is stable compared to the radiographs of 12/25/2012.   Original Report Authenticated By: Britta Mccreedy, M.D.   The ulnar gutter splint was reapplied. Patient tolerated procedure well.  Assessment and Plan: 32 y.o. female with oblique minimally displaced fracture of the third metacarpal of the right hand.  Reapplied splint.  Followup with hand surgery in several days.  Refilled Percocet.  Discussed warning signs or symptoms. Please see discharge instructions. Patient expresses understanding. Sling for comfort as needed     Rodolph Bong, MD 12/29/12 1723

## 2012-12-29 NOTE — ED Notes (Signed)
Pt  Here  For  followup  Of     fx  Of  Hand
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
## 2012-12-29 NOTE — ED Notes (Signed)
I attempted to explain the delay of the pt receiving her splint, but the pt refused to allow an explanation.  The pt stated that we are just to slow, and that we were being slow for her on purpose.  The pt stated that she wanted to leave and be discharged.  Pt stated that she no longer wished to talk to me, and that someone different needed to come.  I explained that she would have to use a lower tone when talking to the staff and that we would not allow her to yell at the staff.  ED PA made aware

## 2012-12-29 NOTE — ED Provider Notes (Signed)
CSN: [id number]     Arrival date & time 12/29/12  0865 History   First MD Initiated Contact with Patient 12/29/12 [phone]     Chief Complaint  Patient presents with  . Follow-up   HPI  History provided by the patient. Patient is a 32 year old female with recent history of a right hand metacarpal fracture. Patient was seen and evaluated after injury to her right hand. She was found to have a fracture through the third metacarpal bone. She was placed in the oral gutter splint and given orthopedic hand followup. Patient states she's been unable to followup yet because she has lost her insurance card. Patient states that she has been uncomfortable with the splint and she was placed in and has taken it off. She complains that there was still pain around her hand and finger. She denies any new or worsened pain. Denies any weakness or numbness to the fingers. There is persistent swelling around the hand and to the middle finger. She has also run out of her pain medications and is requesting additional pain medications. Denies any other aggravating or alleviating factors. No other associated symptoms.    Past Medical History  Diagnosis Date  . Depression     not on meds while pregnant  . Bipolar 1 disorder, depressed     pt feels stable- not on meds while pregnant  . ADHD (attention deficit hyperactivity disorder)    Past Surgical History  Procedure Laterality Date  . Cesarean section      x 2  . Bunionectomy    . Tonsillectomy    . Dental surgery    . Tubal ligation     No family history on file. History  Substance Use Topics  . Smoking status: Current Every Day Smoker -- 1.00 packs/day    Types: Cigarettes  . Smokeless tobacco: Not on file  . Alcohol Use: No   OB History   Grav Para Term Preterm Abortions TAB SAB Ect Mult Living   4 3 3  0 1 0 1 0 0 3     Review of Systems  Neurological: Negative for weakness and numbness.  All other systems reviewed and are  negative.    Allergies  Review of patient's allergies indicates no known allergies.  Home Medications   Current Outpatient Rx  Name  Route  Sig  Dispense  Refill  . oxyCODONE-acetaminophen (PERCOCET/ROXICET) 5-325 MG per tablet   Oral   Take 1-2 tablets by mouth every 4 (four) hours as needed for pain.   15 tablet   0    BP 113/66  Pulse 76  Temp(Src) 98.3 F (36.8 C) (Oral)  Resp 14  SpO2 97%  LMP 12/04/2012 Physical Exam  Nursing note and vitals reviewed. Constitutional: She is oriented to person, place, and time. She appears well-developed and well-nourished. No distress.  HENT:  Head: Normocephalic.  Cardiovascular: Normal rate and regular rhythm.   Pulmonary/Chest: Effort normal and breath sounds normal.  Abdominal: Soft.  Musculoskeletal: Normal range of motion.  Neurological: She is alert and oriented to person, place, and time.  Skin: Skin is warm and dry. No rash noted.  Psychiatric: She has a normal mood and affect. Her behavior is normal.    ED Course  Procedures  Labs Review Labs Reviewed - No data to display Imaging Review Dg Hand Complete Right  12/29/2012   *RADIOLOGY REPORT*  Clinical Data: Evaluate displacement of the third metacarpal fracture.  The patient is out of splint.  RIGHT HAND - COMPLETE 3+ VIEW  Comparison: Right hand radiographs 12/26/2002  Findings: There is a complete, obliquely-oriented fracture of the proximal to mid aspect of the third metacarpal.  The distal fracture fragment is displaced posteriorly by approximately 3 mm. There is slight overlap of fracture fragments.  Alignment of the fracture fragments appears without significant change compared 12/25/2012.  The joints are aligned.  No additional bony abnormality is identified.  IMPRESSION: Stable alignment of third metacarpal fracture.  Approximately 3 mm posterior displacement of the distal fracture fragment is stable compared to the radiographs of 12/25/2012.   Original Report  Authenticated By: Britta Mccreedy, M.D.    MDM   1. Metacarpal bone fracture, sequela     4:40 AM patient seen and evaluated. She does not appear in any acute distress. She removed her splint and fruit in the trash on her own.  5:40 AM nurse informed me that patient was becoming very upset with having to wait for her splint. She decided to leave without further treatment.      Angus Seller, PA-C 12/30/12 0002

## 2012-12-30 NOTE — ED Provider Notes (Signed)
Medical screening examination/treatment/procedure(s) were performed by non-physician practitioner and as supervising physician I was immediately available for consultation/collaboration.  Rafiq Bucklin K Kenyia Wambolt-Rasch, MD 12/30/12 0016 

## 2013-05-20 ENCOUNTER — Encounter: Payer: Self-pay | Admitting: Obstetrics

## 2013-06-24 IMAGING — US US OB DETAIL+14 WK
2 series · 14 of 28 positions shown · non-contrast
Comparison: none

[Series 1: us ob detail+14 wk · 12 of 82 slices shown (1 of 2)]
[im 4/82]
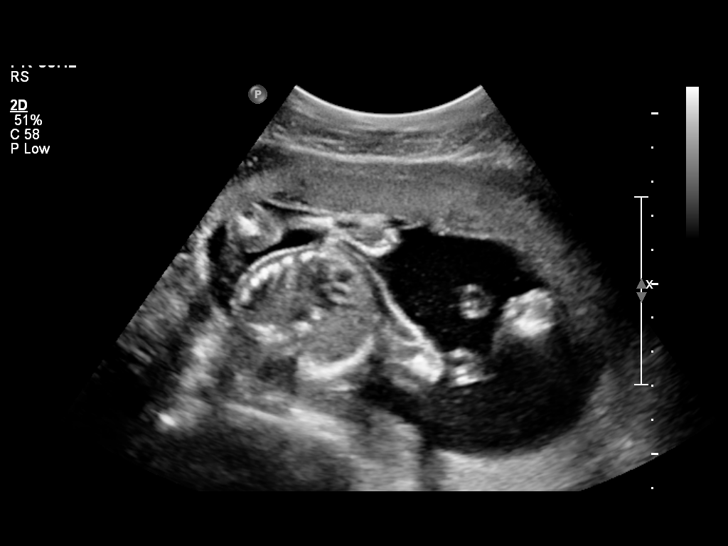
[im 11/82]
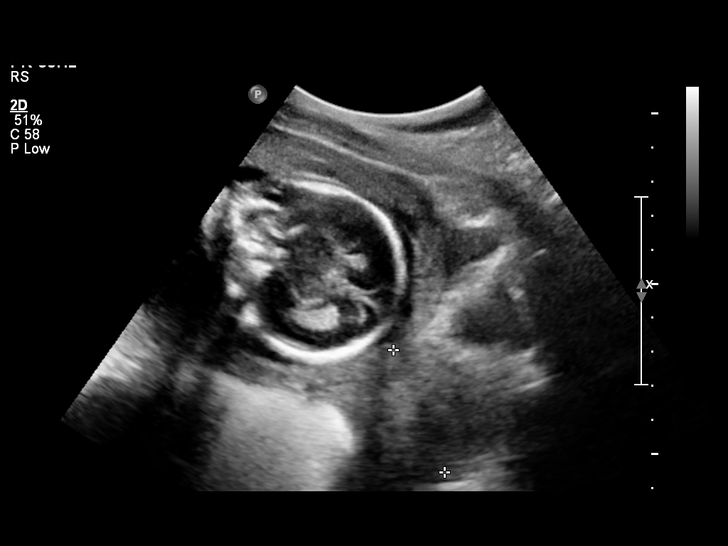
[im 18/82]
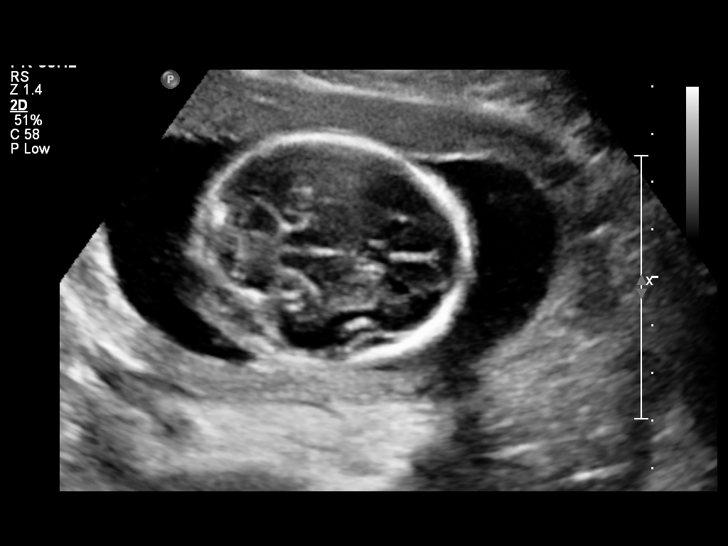
[im 25/82]
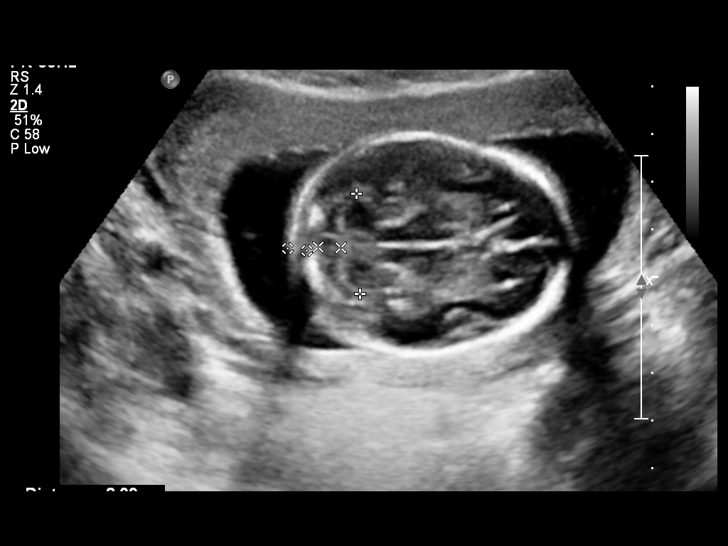
[im 32/82]
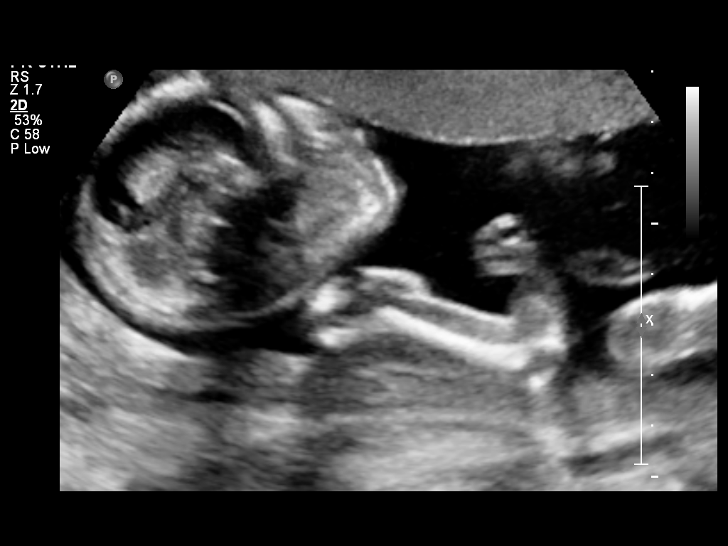
[im 39/82]
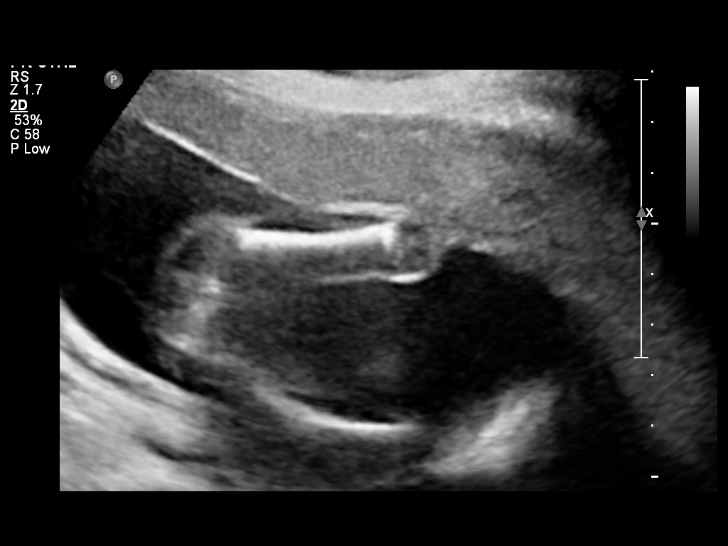
[im 46/82]
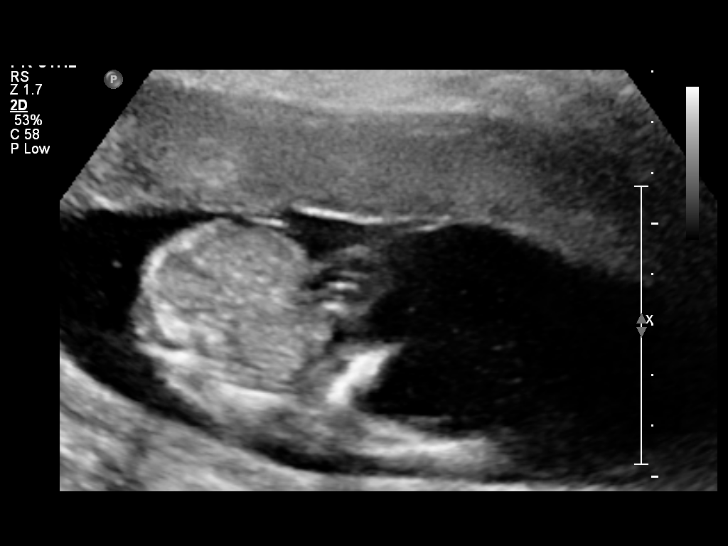
[im 53/82]
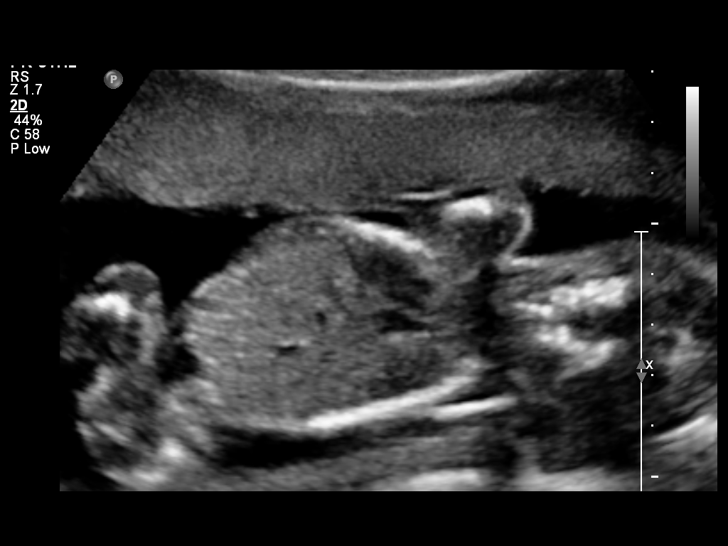
[im 60/82]
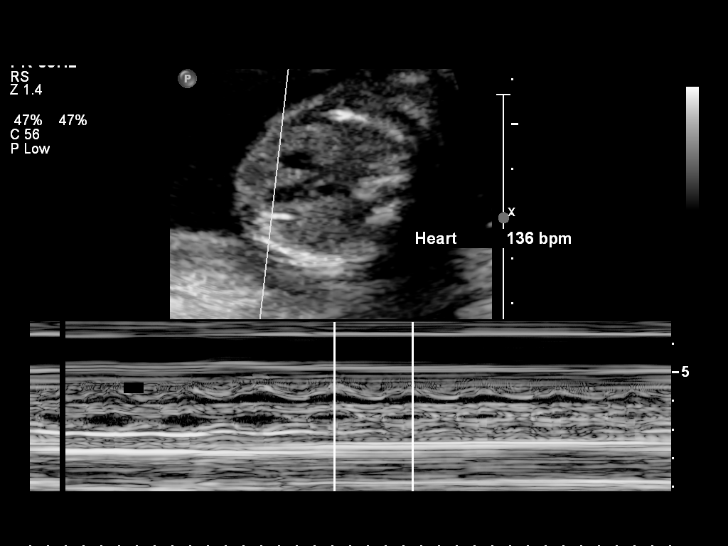
[im 67/82]
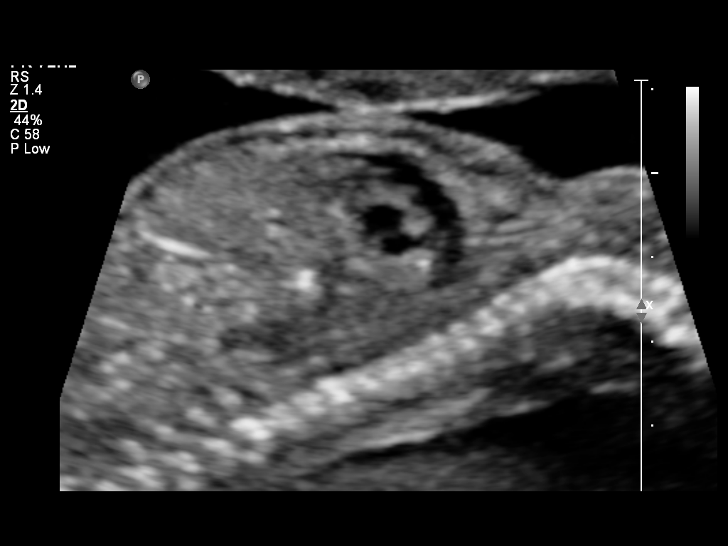
[im 74/82]
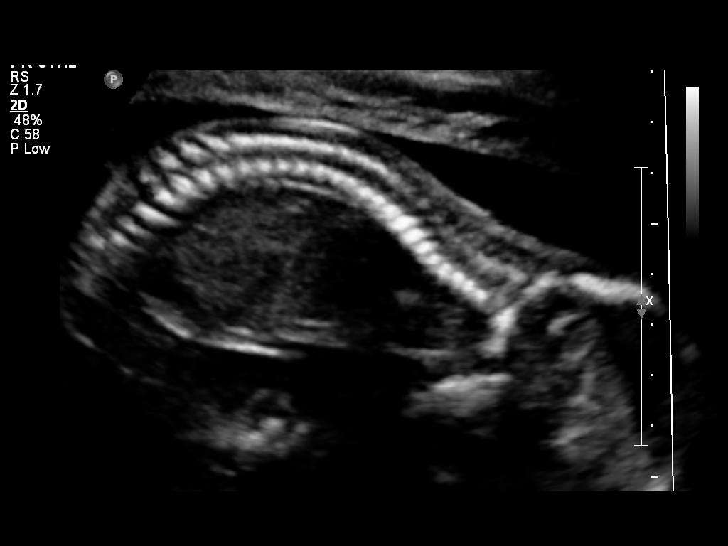
[im 82/82]
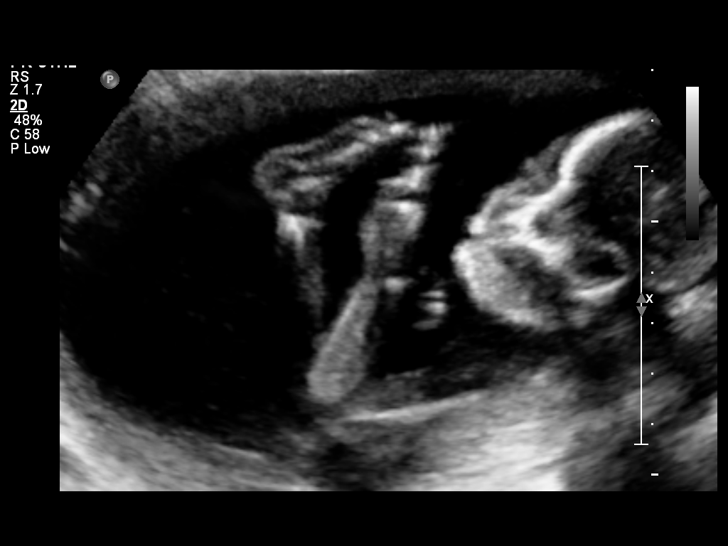

[Series 1: us ob detail+14 wk · 2 of 13 slices shown (2 of 2)]
[im 5/13]
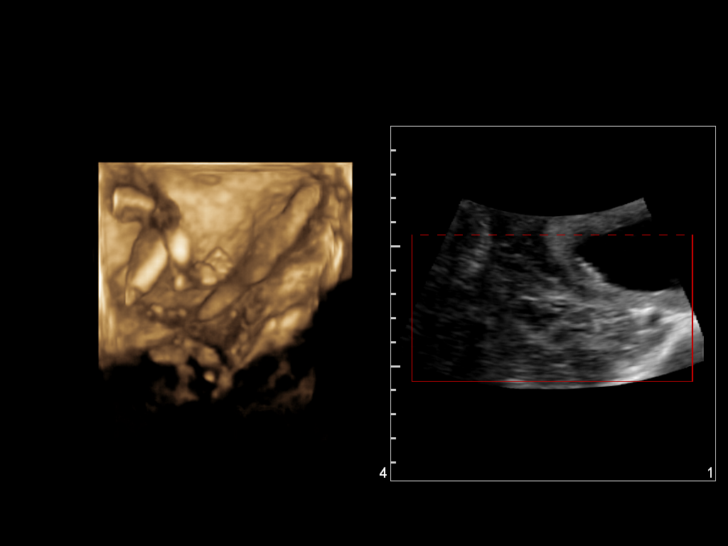
[im 13/13]
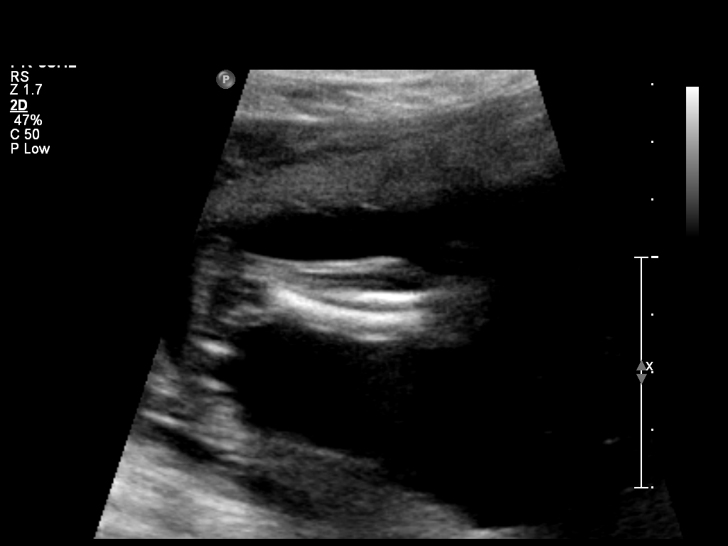

[14 of 28 positions shown; findings below may reference images not displayed]

Canned report from images found in remote index.

Refer to host system for actual result text.

## 2013-10-24 ENCOUNTER — Encounter (HOSPITAL_COMMUNITY): Payer: Self-pay | Admitting: *Deleted

## 2013-10-24 ENCOUNTER — Inpatient Hospital Stay (HOSPITAL_COMMUNITY)
Admission: AD | Admit: 2013-10-24 | Discharge: 2013-10-24 | Disposition: A | Discharge status: Home / Self Care | Payer: Medicaid Other | Source: Ambulatory Visit | Attending: Obstetrics & Gynecology | Admitting: Obstetrics & Gynecology

## 2013-10-24 DIAGNOSIS — N760 Acute vaginitis: Secondary | ICD-10-CM | POA: Insufficient documentation

## 2013-10-24 DIAGNOSIS — F17200 Nicotine dependence, unspecified, uncomplicated: Secondary | ICD-10-CM | POA: Insufficient documentation

## 2013-10-24 DIAGNOSIS — B372 Candidiasis of skin and nail: Secondary | ICD-10-CM | POA: Insufficient documentation

## 2013-10-24 DIAGNOSIS — A499 Bacterial infection, unspecified: Secondary | ICD-10-CM | POA: Insufficient documentation

## 2013-10-24 DIAGNOSIS — B9689 Other specified bacterial agents as the cause of diseases classified elsewhere: Secondary | ICD-10-CM | POA: Insufficient documentation

## 2013-10-24 DIAGNOSIS — F3289 Other specified depressive episodes: Secondary | ICD-10-CM | POA: Insufficient documentation

## 2013-10-24 DIAGNOSIS — F329 Major depressive disorder, single episode, unspecified: Secondary | ICD-10-CM | POA: Insufficient documentation

## 2013-10-24 LAB — WET PREP, GENITAL
Trich, Wet Prep: NONE SEEN
Yeast Wet Prep HPF POC: NONE SEEN

## 2013-10-24 MED ORDER — METRONIDAZOLE 500 MG PO TABS: 500.0000 mg | ORAL_TABLET | Freq: Two times a day (BID) | ORAL | Status: DC

## 2013-10-24 MED ORDER — NYSTATIN-TRIAMCINOLONE 100000-0.1 UNIT/GM-% EX OINT: 1.0000 "application " | TOPICAL_OINTMENT | Freq: Two times a day (BID) | CUTANEOUS | Status: DC

## 2013-10-24 NOTE — Discharge Instructions (Signed)
Bacterial Vaginosis Bacterial vaginosis is an infection of the vagina. It happens when too many of certain germs (bacteria) grow in the vagina. HOME CARE  Take your medicine as told by your doctor.  Finish your medicine even if you start to feel better.  Do not have sex until you finish your medicine and are better.  Tell your sex partner that you have an infection. They should see their doctor for treatment.  Practice safe sex. Use condoms. Have only one sex partner. GET HELP IF:  You are not getting better after 3 days of treatment.  You have more grey fluid (discharge) coming from your vagina than before.  You have more pain than before.  You have a fever. MAKE SURE YOU:   Understand these instructions.  Will watch your condition.  Will get help right away if you are not doing well or get worse. Document Released: 01/30/2008 Document Revised: 02/10/2013 Document Reviewed: 12/02/2012 [facility] IncExitCare Patient Information 2015 [employer], [place]. This information is not intended to replace advice given to you by your health care provider. Make sure you discuss any questions you have with your health care provider. Candida Infection, Adult A candida infection (also called yeast, fungus and Monilia infection) is an overgrowth of yeast that can occur anywhere on the body. A yeast infection commonly occurs in warm, moist body areas. Usually, the infection remains localized but can spread to become a systemic infection. A yeast infection may be a sign of a more severe disease such as diabetes, leukemia, or AIDS. A yeast infection can occur in both men and women. In women, Candida vaginitis is a vaginal infection. It is one of the most common causes of vaginitis. Men usually do not have symptoms or know they have an infection until other problems develop. Men may find out they have a yeast infection because their sex partner has a yeast infection. Uncircumcised men are more likely to get a yeast  infection than circumcised men. This is because the uncircumcised glans is not exposed to air and does not remain as dry as that of a circumcised glans. Older adults may develop yeast infections around dentures. CAUSES  Women  Antibiotics.  Steroid medication taken for a long time.  Being overweight (obese).  Diabetes.  Poor immune condition.  Certain serious medical conditions.  Immune suppressive medications for organ transplant patients.  Chemotherapy.  Pregnancy.  Menstration.  Stress and fatigue.  Intravenous drug use.  Oral contraceptives.  Wearing tight-fitting clothes in the crotch area.  Catching it from a sex partner who has a yeast infection.  Spermicide.  Intravenous, urinary, or other catheters. Men  Catching it from a sex partner who has a yeast infection.  Having oral or anal sex with a person who has the infection.  Spermicide.  Diabetes.  Antibiotics.  Poor immune system.  Medications that suppress the immune system.  Intravenous drug use.  Intravenous, urinary, or other catheters. SYMPTOMS  Women  Thick, white vaginal discharge.  Vaginal itching.  Redness and swelling in and around the vagina.  Irritation of the lips of the vagina and perineum.  Blisters on the vaginal lips and perineum.  Painful sexual intercourse.  Low blood sugar (hypoglycemia).  Painful urination.  Bladder infections.  Intestinal problems such as constipation, indigestion, bad breath, bloating, increase in gas, diarrhea, or loose stools. Men  Men may develop intestinal problems such as constipation, indigestion, bad breath, bloating, increase in gas, diarrhea, or loose stools.  Dry, cracked skin on the  penis with itching or discomfort.  Jock itch.  Dry, flaky skin.  Athlete's foot.  Hypoglycemia. DIAGNOSIS  Women  A history and an exam are performed.  The discharge may be examined under a microscope.  A culture may be taken of the  discharge. Men  A history and an exam are performed.  Any discharge from the penis or areas of cracked skin will be looked at under the microscope and cultured.  Stool samples may be cultured. TREATMENT  Women  Vaginal antifungal suppositories and creams.  Medicated creams to decrease irritation and itching on the outside of the vagina.  Warm compresses to the perineal area to decrease swelling and discomfort.  Oral antifungal medications.  Medicated vaginal suppositories or cream for repeated or recurrent infections.  Wash and dry the irritation areas before applying the cream.  Eating yogurt with lactobacillus may help with prevention and treatment.  Sometimes painting the vagina with gentian violet solution may help if creams and suppositories do not work. Men  Antifungal creams and oral antifungal medications.  Sometimes treatment must continue for 30 days after the symptoms go away to prevent recurrence. HOME CARE INSTRUCTIONS  Women  Use cotton underwear and avoid tight-fitting clothing.  Avoid colored, scented toilet paper and deodorant tampons or pads.  Do not douche.  Keep your diabetes under control.  Finish all the prescribed medications.  Keep your skin clean and dry.  Consume milk or yogurt with lactobacillus active culture regularly. If you get frequent yeast infections and think that is what the infection is, there are over-the-counter medications that you can get. If the infection does not show healing in 3 days, talk to your caregiver.  Tell your sex partner you have a yeast infection. Your partner may need treatment also, especially if your infection does not clear up or recurs. Men  Keep your skin clean and dry.  Keep your diabetes under control.  Finish all prescribed medications.  Tell your sex partner that you have a yeast infection so they can be treated if necessary. SEEK MEDICAL CARE IF:   Your symptoms do not clear up or worsen in  one week after treatment.  You have an oral temperature above 102 F (38.9 C).  You have trouble swallowing or eating for a prolonged time.  You develop blisters on and around your vagina.  You develop vaginal bleeding and it is not your menstrual period.  You develop abdominal pain.  You develop intestinal problems as mentioned above.  You get weak or lightheaded.  You have painful or increased urination.  You have pain during sexual intercourse. MAKE SURE YOU:   Understand these instructions.  Will watch your condition.  Will get help right away if you are not doing well or get worse. Document Released: 05/30/2004 Document Revised: 07/15/2011 Document Reviewed: 09/11/2009 [facility]Exit[facility] Patient Information 2015 [place], [place]. This information is not intended to replace advice given to you by your health care provider. Make sure you discuss any questions you have with your health care provider.

## 2013-10-24 NOTE — MAU Provider Note (Signed)
History     CSN: [id number]  Arrival date and time: 10/24/13 1329   First Provider Initiated Contact with Patient 10/24/13 1411      No chief complaint on file.  HPI Michelle [name] is 33 y.o. [phone] presents with a rash between her thighs that began 1 week ago.  Has tried Neosporin and Gold Bond powder "it dries it up and then it returns".  She is a patient of Dr. Marcia BrashJackson-Moore's.  Denies vaginal discharge or bleeding.  LMP 10/05/13.  Has vaginal itching with her period.  Had BTL--"tied" 2+ years ago.    Past Medical History  Diagnosis Date  . Depression     not on meds while pregnant  . Bipolar 1 disorder, depressed     pt feels stable- not on meds while pregnant  . ADHD (attention deficit hyperactivity disorder)     Past Surgical History  Procedure Laterality Date  . Cesarean section      x 2  . Bunionectomy    . Tonsillectomy    . Dental surgery    . Tubal ligation      History reviewed. No pertinent family history.  History  Substance Use Topics  . Smoking status: Current Every Day Smoker -- 1.00 packs/day    Types: Cigarettes  . Smokeless tobacco: Not on file  . Alcohol Use: No    Allergies: No Known Allergies  Prescriptions prior to admission  Medication Sig Dispense Refill  . acetaminophen (TYLENOL) 500 MG tablet Take 1,000 mg by mouth every 6 (six) hours as needed for headache.      . ARIPiprazole (ABILIFY) 10 MG tablet Take 10 mg by mouth daily.      Marland Kitchen. gabapentin (NEURONTIN) 300 MG capsule Take 300 mg by mouth 2 (two) times daily. Pt takes in the morning and at night        Review of Systems  Constitutional: Negative for fever and chills.  Gastrointestinal: Negative for abdominal pain.  Genitourinary: Negative for dysuria, urgency, frequency and hematuria.       Rash between thighs and vaginal itching with menses   Physical Exam   Blood pressure 114/72, pulse 74, temperature 98.4 F (36.9 C), temperature source Oral, resp. rate 16, height 5'  4" (1.626 m), weight 173 lb (78.472 kg), last menstrual period 10/05/2013, SpO2 100.00%.  Physical Exam  Constitutional: She is oriented to person, place, and time. She appears well-developed and well-nourished.  HENT:  Head: Normocephalic.  GI: There is no tenderness.  Genitourinary: There is rash on the right labia. There is no tenderness or lesion on the right labia. There is rash on the left labia. There is no tenderness or lesion on the left labia. Uterus is not enlarged and not tender. Cervix exhibits no motion tenderness, no discharge and no friability. Right adnexum displays no mass, no tenderness and no fullness. Left adnexum displays no mass, no tenderness and no fullness. No erythema, tenderness or bleeding around the vagina. No vaginal discharge found.  There is a large area of dry flakey rash on either side of the labia consistent with yeast.  This area extends to the anus.  Neg for excoriations, weeping, discharge or redness.    Neurological: She is alert and oriented to person, place, and time.  Skin: Skin is warm and dry.  Psychiatric: She has a normal mood and affect. Her behavior is normal. Judgment and thought content normal.   Results for orders placed during the hospital encounter of 10/24/13 (  from the past 24 hour(s))  WET PREP, GENITAL     Status: Abnormal   Collection Time    10/24/13  2:20 PM      Result Value Ref Range   Yeast Wet Prep HPF POC NONE SEEN  NONE SEEN   Trich, Wet Prep NONE SEEN  NONE SEEN   Clue Cells Wet Prep HPF POC MODERATE (*) NONE SEEN   WBC, Wet Prep HPF POC FEW (*) NONE SEEN   MAU Course  Procedures GC/CHL culture to lab  MDM Reviewed labs with patient and instructed to take Flagyl 500mg  bid and use the creme external only bid.  Follow up with Dr. Tamela OddiJackson-Moore  Assessment and Plan  A:  Candidiasis  of Skin     Bacterial vaginosis  P:  Rx for Flagyl 500mg  bid X 1 week.  Pelvic rest and no ETOH X 1 week      Instructed to use Mycolog  Creme small amount to affected area bid prn     Keep area clean and dry     Follow up with Dr. Lonia ChimeraJackson-Moore  KEY,EVE M 10/24/2013, 2:46 PM

## 2013-10-24 NOTE — MAU Note (Signed)
Patient presents to MAU with c/o raised red rash between her thighs that she noticed has gotten worse the past week.

## 2013-10-28 ENCOUNTER — Ambulatory Visit: Payer: Medicaid Other | Admitting: Obstetrics & Gynecology

## 2014-01-11 ENCOUNTER — Telehealth: Payer: Self-pay | Admitting: *Deleted

## 2014-01-11 NOTE — Telephone Encounter (Signed)
Fax refill request received- Loratadine 

## 2014-01-12 ENCOUNTER — Other Ambulatory Visit: Payer: Self-pay | Admitting: *Deleted

## 2014-01-12 DIAGNOSIS — J302 Other seasonal allergic rhinitis: Secondary | ICD-10-CM

## 2014-01-12 MED ORDER — LORATADINE 10 MG PO CAPS: 1.0000 | ORAL_CAPSULE | Freq: Every day | ORAL | Status: DC

## 2014-01-12 NOTE — Telephone Encounter (Signed)
Rx sent to pharmacy   

## 2014-01-12 NOTE — Telephone Encounter (Signed)
OK to refill

## 2014-02-01 ENCOUNTER — Encounter (HOSPITAL_COMMUNITY): Payer: Self-pay | Admitting: Emergency Medicine

## 2014-02-01 ENCOUNTER — Emergency Department (HOSPITAL_COMMUNITY)
Admission: EM | Admit: 2014-02-01 | Discharge: 2014-02-01 | Disposition: A | Discharge status: Home / Self Care | Payer: Medicaid Other | Attending: Emergency Medicine | Admitting: Emergency Medicine

## 2014-02-01 ENCOUNTER — Emergency Department (HOSPITAL_COMMUNITY): Payer: Medicaid Other

## 2014-02-01 DIAGNOSIS — F17200 Nicotine dependence, unspecified, uncomplicated: Secondary | ICD-10-CM | POA: Insufficient documentation

## 2014-02-01 DIAGNOSIS — B9789 Other viral agents as the cause of diseases classified elsewhere: Secondary | ICD-10-CM

## 2014-02-01 DIAGNOSIS — J069 Acute upper respiratory infection, unspecified: Secondary | ICD-10-CM

## 2014-02-01 DIAGNOSIS — Z79899 Other long term (current) drug therapy: Secondary | ICD-10-CM | POA: Insufficient documentation

## 2014-02-01 DIAGNOSIS — H9209 Otalgia, unspecified ear: Secondary | ICD-10-CM | POA: Insufficient documentation

## 2014-02-01 DIAGNOSIS — F319 Bipolar disorder, unspecified: Secondary | ICD-10-CM | POA: Insufficient documentation

## 2014-02-01 MED ORDER — PREDNISONE 50 MG PO TABS: 50.0000 mg | ORAL_TABLET | Freq: Every day | ORAL | Status: DC

## 2014-02-01 MED ORDER — ACETAMINOPHEN-CODEINE 120-12 MG/5ML PO SOLN: 10.0000 mL | ORAL | Status: DC | PRN

## 2014-02-01 MED ORDER — GUAIFENESIN ER 1200 MG PO TB12: 1.0000 | ORAL_TABLET | Freq: Two times a day (BID) | ORAL | Status: DC

## 2014-02-01 NOTE — ED Notes (Signed)
Pt c/o bilateral ear pain with body aches and fever; pt sts some nasal congestion

## 2014-02-01 NOTE — Discharge Instructions (Signed)
Return here as needed.  Increase your fluid intake, and rest as much possible.  Your chest x-ray did not show any abnormalities

## 2014-02-01 NOTE — ED Provider Notes (Signed)
CSN: [id number]     Arrival date & time 02/01/14  1228 History   First MD Initiated Contact with Patient 02/01/14 1542     Chief Complaint  Patient presents with  . Otalgia  . Influenza     (Consider location/radiation/quality/duration/timing/severity/associated sxs/prior Treatment) Patient is a 33 y.o. female presenting with URI.  URI Presenting symptoms: congestion, cough, ear pain, fatigue and rhinorrhea   Presenting symptoms: no sore throat   Severity:  Moderate Onset quality:  Gradual Timing:  Constant Progression:  Unchanged Chronicity:  New Relieved by:  Nothing Worsened by:  Nothing tried Ineffective treatments:  None tried Associated symptoms: headaches   Associated symptoms: no arthralgias, no myalgias, no neck pain, no sinus pain, no sneezing, no swollen glands and no wheezing     Past Medical History  Diagnosis Date  . Depression     not on meds while pregnant  . Bipolar 1 disorder, depressed     pt feels stable- not on meds while pregnant  . ADHD (attention deficit hyperactivity disorder)    Past Surgical History  Procedure Laterality Date  . Cesarean section      x 2  . Bunionectomy    . Tonsillectomy    . Dental surgery    . Tubal ligation     History reviewed. No pertinent family history. History  Substance Use Topics  . Smoking status: Current Every Day Smoker -- 1.00 packs/day    Types: Cigarettes  . Smokeless tobacco: Not on file  . Alcohol Use: No   OB History   Grav Para Term Preterm Abortions TAB SAB Ect Mult Living   4 3 3  0 1 0 1 0 0 3     Review of Systems  Constitutional: Positive for fatigue.  HENT: Positive for congestion, ear pain and rhinorrhea. Negative for sneezing and sore throat.   Respiratory: Positive for cough. Negative for wheezing.   Musculoskeletal: Negative for arthralgias, myalgias and neck pain.  Neurological: Positive for headaches.     All other systems negative except as documented in the HPI. All  pertinent positives and negatives as reviewed in the HPI.   Allergies  Review of patient's allergies indicates no known allergies.  Home Medications   Prior to Admission medications   Medication Sig Start Date End Date Taking? Authorizing Provider  acetaminophen (TYLENOL) 500 MG tablet Take 1,000 mg by mouth every 6 (six) hours as needed for headache.    Historical Provider, MD  ARIPiprazole (ABILIFY) 10 MG tablet Take 10 mg by mouth daily.    Historical Provider, MD  gabapentin (NEURONTIN) 300 MG capsule Take 300 mg by mouth 2 (two) times daily. Pt takes in the morning and at night    Historical Provider, MD  Loratadine 10 MG CAPS Take 1 capsule (10 mg total) by mouth daily. 01/12/14   Antionette CharLisa Jackson-Moore, MD  metroNIDAZOLE (FLAGYL) 500 MG tablet Take 1 tablet (500 mg total) by mouth 2 (two) times daily. 10/24/13   Elta GuadeloupeEvelyn M Key, NP  nystatin-triamcinolone ointment (MYCOLOG) Apply 1 application topically 2 (two) times daily. 10/24/13   Elta GuadeloupeEvelyn M Key, NP   BP 121/71  Pulse 98  Temp(Src) 100.1 F (37.8 C) (Oral)  Resp 20  SpO2 99%  LMP 01/25/2014 Physical Exam  Nursing note and vitals reviewed. Constitutional: She is oriented to person, place, and time. She appears well-developed and well-nourished. No distress.  HENT:  Head: Normocephalic and atraumatic.  Right Ear: Tympanic membrane normal.  Left Ear: Tympanic membrane  normal.  Nose: Rhinorrhea present. No nose lacerations, nasal deformity, septal deviation or nasal septal hematoma.  Mouth/Throat: Uvula is midline and oropharynx is clear and moist. Mucous membranes are not pale and not dry. No oropharyngeal exudate or posterior oropharyngeal edema.  Eyes: Pupils are equal, round, and reactive to light.  Neck: Normal range of motion. Neck supple.  Cardiovascular: Normal rate, regular rhythm and normal heart sounds.  Exam reveals no friction rub.   No murmur heard. Pulmonary/Chest: Effort normal and breath sounds normal. No respiratory  distress. She has no wheezes. She has no rales.  Neurological: She is alert and oriented to person, place, and time. She exhibits normal muscle tone. Coordination normal.  Skin: Skin is warm and dry. No erythema.    ED Course  Procedures (including critical care time)  Imaging Review Dg Chest 2 View  02/01/2014   CLINICAL DATA:  Cough and fever  EXAM: CHEST  2 VIEW  COMPARISON:  02/25/2008  FINDINGS: The heart size and mediastinal contours are within normal limits. Both lungs are clear. The visualized skeletal structures are unremarkable.  IMPRESSION: No active cardiopulmonary disease.   Electronically Signed   By: Alcide Clever M.D.   On: 02/01/2014 16:43    Patient will be treated for URI. Told to return here as needed.     Carlyle Dolly, PA-C 02/07/14 [phone]

## 2014-02-08 NOTE — ED Provider Notes (Signed)
Medical screening examination/treatment/procedure(s) were performed by non-physician practitioner and as supervising physician I was immediately available for consultation/collaboration.   EKG Interpretation None        Purvis SheffieldForrest Willis Holquin, MD 02/08/14 1544

## 2014-03-07 ENCOUNTER — Encounter (HOSPITAL_COMMUNITY): Payer: Self-pay | Admitting: Emergency Medicine

## 2014-05-02 ENCOUNTER — Encounter: Payer: Self-pay | Admitting: *Deleted

## 2014-05-03 ENCOUNTER — Encounter: Payer: Self-pay | Admitting: Obstetrics & Gynecology

## 2016-05-09 ENCOUNTER — Ambulatory Visit: Payer: Medicaid Other | Admitting: Obstetrics

## 2016-06-06 ENCOUNTER — Ambulatory Visit (INDEPENDENT_AMBULATORY_CARE_PROVIDER_SITE_OTHER): Payer: Medicare Other | Admitting: Obstetrics

## 2016-06-06 ENCOUNTER — Encounter: Payer: Self-pay | Admitting: Obstetrics

## 2016-06-06 ENCOUNTER — Other Ambulatory Visit (HOSPITAL_COMMUNITY)
Admission: RE | Admit: 2016-06-06 | Discharge: 2016-06-06 | Disposition: A | Discharge status: Home / Self Care | Payer: Medicare Other | Source: Ambulatory Visit | Attending: Obstetrics | Admitting: Obstetrics

## 2016-06-06 VITALS — BP 125/86 | HR 95 | Wt 217.9 lb

## 2016-06-06 DIAGNOSIS — Z01419 Encounter for gynecological examination (general) (routine) without abnormal findings: Secondary | ICD-10-CM | POA: Diagnosis not present

## 2016-06-06 DIAGNOSIS — N939 Abnormal uterine and vaginal bleeding, unspecified: Secondary | ICD-10-CM

## 2016-06-06 DIAGNOSIS — Z0000 Encounter for general adult medical examination without abnormal findings: Secondary | ICD-10-CM

## 2016-06-06 DIAGNOSIS — Z01411 Encounter for gynecological examination (general) (routine) with abnormal findings: Secondary | ICD-10-CM | POA: Diagnosis present

## 2016-06-06 DIAGNOSIS — Z113 Encounter for screening for infections with a predominantly sexual mode of transmission: Secondary | ICD-10-CM

## 2016-06-06 DIAGNOSIS — Z124 Encounter for screening for malignant neoplasm of cervix: Secondary | ICD-10-CM

## 2016-06-06 DIAGNOSIS — Z1151 Encounter for screening for human papillomavirus (HPV): Secondary | ICD-10-CM | POA: Insufficient documentation

## 2016-06-06 LAB — POCT URINE PREGNANCY: Preg Test, Ur: NEGATIVE

## 2016-06-06 MED ORDER — MEDROXYPROGESTERONE ACETATE 150 MG/ML IM SUSP: 150.0000 mg | INTRAMUSCULAR | 4 refills | Status: AC

## 2016-06-06 NOTE — Progress Notes (Signed)
Patient is in the office for annual exam- she is requesting STD check. Patient is experiencing breast discharge with manipulation. Patient is concerned about pregnancy- she was on Depo Provera and she was having intermittent bleeding. She has not had any bleeding since September/October.

## 2016-06-06 NOTE — Progress Notes (Signed)
Subjective:        Michelle [name] is a 36 y.o. female here for a routine exam.  Current complaints: Breast milk production with manipulation.  Denies breast pain.    Personal health questionnaire:  Is patient Ashkenazi Jewish, have a family history of breast and/or ovarian cancer: no Is there a family history of uterine cancer diagnosed at age < 58, gastrointestinal cancer, urinary tract cancer, family member who is a Personnel officer syndrome-associated carrier: no Is the patient overweight and hypertensive, family history of diabetes, personal history of gestational diabetes, preeclampsia or PCOS: no Is patient over 17, have PCOS,  family history of premature CHD under age 27, diabetes, smoke, have hypertension or peripheral artery disease:  no At any time, has a partner hit, kicked or otherwise hurt or frightened you?: no Over the past 2 weeks, have you felt down, depressed or hopeless?: no Over the past 2 weeks, have you felt little interest or pleasure in doing things?:no   Gynecologic History No LMP recorded. Contraception: tubal ligation Last Pap: unknown. Results were: normal Last mammogram: n/a. Results were: n/a  Obstetric History OB History  Gravida Para Term Preterm AB Living  4 3 3  0 1 3  SAB TAB Ectopic Multiple Live Births  1 0 0 0      # Outcome Date GA Lbr Len/2nd Weight Sex Delivery Anes PTL Lv  4 Term 03/20/11 [redacted]w[redacted]d  5 lb 15.9 oz (2.719 kg) M CS-LTranv Spinal    3 SAB           2 Term           1 Term               Past Medical History:  Diagnosis Date  . ADHD (attention deficit hyperactivity disorder)   . Bipolar 1 disorder, depressed (HCC)    pt feels stable- not on meds while pregnant  . Depression    not on meds while pregnant    Past Surgical History:  Procedure Laterality Date  . BUNIONECTOMY    . CESAREAN SECTION     x 2  . DENTAL SURGERY    . HAND SURGERY    . TONSILLECTOMY    . TUBAL LIGATION       Current Outpatient Prescriptions:   .  medroxyPROGESTERone (DEPO-PROVERA) 150 MG/ML injection, Inject 1 mL (150 mg total) into the muscle every 3 (three) months., Disp: 1 mL, Rfl: 4 No Known Allergies  Social History  Substance Use Topics  . Smoking status: Current Every Day Smoker    Packs/day: 3.00    Types: Cigarettes  . Smokeless tobacco: Never Used  . Alcohol use Yes     Comment: rare    History reviewed. No pertinent family history.    Review of Systems  Constitutional: negative for fatigue and weight loss Respiratory: negative for cough and wheezing Cardiovascular: negative for chest pain, fatigue and palpitations Gastrointestinal: negative for abdominal pain and change in bowel habits Musculoskeletal:negative for myalgias Neurological: negative for gait problems and tremors Behavioral/Psych: negative for abusive relationship, depression Endocrine: negative for temperature intolerance    Genitourinary:negative for abnormal menstrual periods, genital lesions, hot flashes, sexual problems and vaginal discharge Integument/breast: negative for breast lump, breast tenderness, nipple discharge and skin lesion(s)    Objective:       BP 125/86   Pulse 95   Wt 217 lb 14.4 oz (98.8 kg)   BMI 37.40 kg/m  General:   alert  Skin:   no rash or abnormalities  Lungs:   clear to auscultation bilaterally  Heart:   regular rate and rhythm, S1, S2 normal, no murmur, click, rub or gallop  Breasts:   normal without suspicious masses, skin or nipple changes or axillary nodes  Abdomen:  normal findings: no organomegaly, soft, non-tender and no hernia  Pelvis:  External genitalia: normal general appearance Urinary system: urethral meatus normal and bladder without fullness, nontender Vaginal: normal without tenderness, induration or masses Cervix: normal appearance Adnexa: normal bimanual exam Uterus: anteverted and non-tender, normal size   Lab Review Urine pregnancy test Labs reviewed yes Radiologic studies  reviewed no  50% of 20 min visit spent on counseling and coordination of care.    Assessment:    Healthy female exam.   Lactation - iatrogenic H/O of AUB - menorrhagia.   Plan:    Encouraged to stop squeezing breasts. Continue Depo for AUB  Education reviewed: calcium supplements, low fat, low cholesterol diet, safe sex/STD prevention, self breast exams and weight bearing exercise. Follow up in: 1 year.   Meds ordered this encounter  Medications  . medroxyPROGESTERone (DEPO-PROVERA) 150 MG/ML injection    Sig: Inject 1 mL (150 mg total) into the muscle every 3 (three) months.    Dispense:  1 mL    Refill:  4   Orders Placed This Encounter  Procedures  . POCT urine pregnancy      Patient ID: Michelle [name], female   DOB: 05/17/1980, 36 y.o.   MRN: [id number]

## 2016-06-11 LAB — CERVICOVAGINAL ANCILLARY ONLY
BACTERIAL VAGINITIS: NEGATIVE
CHLAMYDIA, DNA PROBE: NEGATIVE
Candida vaginitis: POSITIVE — AB
NEISSERIA GONORRHEA: NEGATIVE
Trichomonas: NEGATIVE

## 2016-06-12 ENCOUNTER — Telehealth: Payer: Self-pay

## 2016-06-12 ENCOUNTER — Other Ambulatory Visit: Payer: Self-pay | Admitting: Obstetrics

## 2016-06-12 ENCOUNTER — Ambulatory Visit: Payer: Medicare Other

## 2016-06-12 DIAGNOSIS — B373 Candidiasis of vulva and vagina: Secondary | ICD-10-CM

## 2016-06-12 DIAGNOSIS — B3731 Acute candidiasis of vulva and vagina: Secondary | ICD-10-CM

## 2016-06-12 LAB — CYTOLOGY - PAP
DIAGNOSIS: NEGATIVE
HPV (WINDOPATH): NOT DETECTED

## 2016-06-12 MED ORDER — FLUCONAZOLE 150 MG PO TABS: 150.0000 mg | ORAL_TABLET | Freq: Once | ORAL | 2 refills | Status: AC

## 2016-06-12 NOTE — Telephone Encounter (Signed)
Pt called for test results. Pap smear still pending. Positive Yeast on swab. Diflucan 150 mg 1 tab po STAT sent to the pharmacy. Gc, Ct, Trich all neg.

## 2016-06-18 ENCOUNTER — Ambulatory Visit (INDEPENDENT_AMBULATORY_CARE_PROVIDER_SITE_OTHER): Payer: Medicare Other

## 2016-06-18 VITALS — BP 117/83 | HR 86 | Wt 221.2 lb

## 2016-06-18 DIAGNOSIS — Z30013 Encounter for initial prescription of injectable contraceptive: Secondary | ICD-10-CM

## 2016-06-18 DIAGNOSIS — Z3202 Encounter for pregnancy test, result negative: Secondary | ICD-10-CM

## 2016-06-18 DIAGNOSIS — Z3042 Encounter for surveillance of injectable contraceptive: Secondary | ICD-10-CM

## 2016-06-18 LAB — POCT URINE PREGNANCY: PREG TEST UR: NEGATIVE

## 2016-06-18 MED ORDER — MEDROXYPROGESTERONE ACETATE 150 MG/ML IM SUSP
150.0000 mg | Freq: Once | INTRAMUSCULAR | Status: AC
  Administered 2016-06-18: 150 mg via INTRAMUSCULAR

## 2016-06-18 NOTE — Progress Notes (Signed)
Patient is here for a DEPO Injection. UPT-NEGATIVE. Shot given in  Right deltoid. Next shot 5/1-5/15, 2018.  Administrations This Visit    medroxyPROGESTERone (DEPO-PROVERA) injection 150 mg    Admin Date 06/18/2016 Action Given Dose 150 mg Route Intramuscular Administered By Maretta Beesarol J McGlashan, RMA

## 2016-06-20 ENCOUNTER — Ambulatory Visit: Payer: Medicaid Other | Admitting: Obstetrics

## 2016-08-03 ENCOUNTER — Emergency Department (HOSPITAL_COMMUNITY)
Admission: EM | Admit: 2016-08-03 | Discharge: 2016-08-03 | Disposition: A | Discharge status: Home / Self Care | Payer: Medicare Other | Attending: Emergency Medicine | Admitting: Emergency Medicine

## 2016-08-03 ENCOUNTER — Encounter (HOSPITAL_COMMUNITY): Payer: Self-pay | Admitting: *Deleted

## 2016-08-03 ENCOUNTER — Emergency Department (HOSPITAL_COMMUNITY): Payer: Medicare Other

## 2016-08-03 DIAGNOSIS — F17210 Nicotine dependence, cigarettes, uncomplicated: Secondary | ICD-10-CM | POA: Insufficient documentation

## 2016-08-03 DIAGNOSIS — F909 Attention-deficit hyperactivity disorder, unspecified type: Secondary | ICD-10-CM | POA: Insufficient documentation

## 2016-08-03 DIAGNOSIS — R51 Headache: Secondary | ICD-10-CM | POA: Insufficient documentation

## 2016-08-03 DIAGNOSIS — Z79899 Other long term (current) drug therapy: Secondary | ICD-10-CM | POA: Diagnosis not present

## 2016-08-03 DIAGNOSIS — J0100 Acute maxillary sinusitis, unspecified: Secondary | ICD-10-CM | POA: Diagnosis not present

## 2016-08-03 DIAGNOSIS — H9203 Otalgia, bilateral: Secondary | ICD-10-CM | POA: Diagnosis present

## 2016-08-03 LAB — CBC WITH DIFFERENTIAL/PLATELET
Basophils Absolute: 0 10*3/uL (ref 0.0–0.1)
Basophils Relative: 0 %
Eosinophils Absolute: 0 10*3/uL (ref 0.0–0.7)
Eosinophils Relative: 0 %
HCT: 40.6 % (ref 36.0–46.0)
HEMOGLOBIN: 13.1 g/dL (ref 12.0–15.0)
LYMPHS ABS: 2.1 10*3/uL (ref 0.7–4.0)
LYMPHS PCT: 13 %
MCH: 27.5 pg (ref 26.0–34.0)
MCHC: 32.3 g/dL (ref 30.0–36.0)
MCV: 85.3 fL (ref 78.0–100.0)
Monocytes Absolute: 0.6 10*3/uL (ref 0.1–1.0)
Monocytes Relative: 3 %
NEUTROS ABS: 14.1 10*3/uL — AB (ref 1.7–7.7)
NEUTROS PCT: 84 %
Platelets: 233 10*3/uL (ref 150–400)
RBC: 4.76 MIL/uL (ref 3.87–5.11)
RDW: 13.8 % (ref 11.5–15.5)
WBC: 16.8 10*3/uL — AB (ref 4.0–10.5)

## 2016-08-03 LAB — PREGNANCY, URINE: PREG TEST UR: NEGATIVE

## 2016-08-03 LAB — COMPREHENSIVE METABOLIC PANEL
ALBUMIN: 3.9 g/dL (ref 3.5–5.0)
ALT: 19 U/L (ref 14–54)
AST: 40 U/L (ref 15–41)
Alkaline Phosphatase: 74 U/L (ref 38–126)
Anion gap: 8 (ref 5–15)
BILIRUBIN TOTAL: 0.3 mg/dL (ref 0.3–1.2)
BUN: 14 mg/dL (ref 6–20)
CHLORIDE: 105 mmol/L (ref 101–111)
CO2: 21 mmol/L — ABNORMAL LOW (ref 22–32)
Calcium: 9.1 mg/dL (ref 8.9–10.3)
Creatinine, Ser: 1.08 mg/dL — ABNORMAL HIGH (ref 0.44–1.00)
GFR calc Af Amer: >60 mL/min (ref >60)
GFR calc non Af Amer: >60 mL/min (ref >60)
Glucose, Bld: 118 mg/dL — ABNORMAL HIGH (ref 65–99)
Potassium: 3.4 mmol/L — ABNORMAL LOW (ref 3.5–5.1)
SODIUM: 134 mmol/L — AB (ref 135–145)
Total Protein: 7.5 g/dL (ref 6.5–8.1)

## 2016-08-03 LAB — RAPID URINE DRUG SCREEN, HOSP PERFORMED
Amphetamines: NOT DETECTED
BARBITURATES: NOT DETECTED
Benzodiazepines: NOT DETECTED
COCAINE: POSITIVE — AB
Opiates: NOT DETECTED
Tetrahydrocannabinol: NOT DETECTED

## 2016-08-03 MED ORDER — AZITHROMYCIN 250 MG PO TABS: 250.0000 mg | ORAL_TABLET | Freq: Every day | ORAL | 0 refills | Status: AC

## 2016-08-03 MED ORDER — PREDNISONE 20 MG PO TABS: 40.0000 mg | ORAL_TABLET | Freq: Every day | ORAL | 0 refills | Status: AC

## 2016-08-03 MED ORDER — MECLIZINE HCL 25 MG PO TABS: 25.0000 mg | ORAL_TABLET | Freq: Three times a day (TID) | ORAL | 0 refills | Status: AC | PRN

## 2016-08-03 MED ORDER — MECLIZINE HCL 25 MG PO TABS
25.0000 mg | ORAL_TABLET | Freq: Once | ORAL | Status: AC
  Administered 2016-08-03: 25 mg via ORAL
  Filled 2016-08-03: qty 1

## 2016-08-03 NOTE — Discharge Instructions (Signed)
Read the information below.  Your CT scan is remarkable for sinusitis, you have an elevated white blood cell count, which may be related to your sinus infection. You are being prescribed antibiotics and steroids for symptomatic relief.  Your creatinine (measure of kidney function) is slightly elevated. Please stay well hydrated. Follow up with primary doctor within one week for recheck.  I have also prescribed meclizine for relief of dizziness.  Tylenol as well as Motrin per package instructions for pain relief.  Stay hydrated and get plenty of rest.  If decreased hearing persists please follow up with Ear, Nose, and Throat contact information provided.  Use the prescribed medication as directed.  Please discuss all new medications with your pharmacist.   You may return to the Emergency Department at any time for worsening condition or any new symptoms that concern you. Return if develop visual changes, neurologic symptoms, pain in chest, difficulty breathing.

## 2016-08-03 NOTE — ED Triage Notes (Signed)
Pt refused to have IV started until Her Aunt was in room to hold her hand.  Nurse first called but unable to locate Aunt in front lobby.

## 2016-08-03 NOTE — ED Triage Notes (Signed)
PT Aunt returned to room . Attempted IV but was unable to access site. PT very anxious and creaming during IV insertion .

## 2016-08-03 NOTE — ED Triage Notes (Signed)
Pt reports ear fullness and pain since last night.

## 2016-08-03 NOTE — ED Triage Notes (Signed)
I verified with NT that Pt was ambulatory from Front lobby to Room C-29 . No assistance was needed. Pt also reported her Landlord told her this morning she looked unsteady when she walked .

## 2016-08-03 NOTE — ED Triage Notes (Signed)
Pt standing at the door of her room holding on to door frame  because she was unsteady. Pt observed to be unable to take a step. Pt was assisted to chair . WC  Provided for PT and SN rolled Pt to BR and stayed with Pt while Pt voided. Pt was then returned to her room and assisted into chair in room.
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
## 2016-08-07 ENCOUNTER — Emergency Department (HOSPITAL_COMMUNITY)
Admission: EM | Admit: 2016-08-07 | Discharge: 2016-08-07 | Disposition: A | Discharge status: Home / Self Care | Payer: Medicare Other | Source: Home / Self Care | Attending: Emergency Medicine | Admitting: Emergency Medicine

## 2016-08-07 ENCOUNTER — Encounter (HOSPITAL_COMMUNITY): Payer: Self-pay | Admitting: Emergency Medicine

## 2016-08-07 ENCOUNTER — Emergency Department (HOSPITAL_COMMUNITY)
Admission: EM | Admit: 2016-08-07 | Discharge: 2016-08-07 | Disposition: A | Discharge status: Home / Self Care | Payer: Medicare Other | Source: Home / Self Care

## 2016-08-07 DIAGNOSIS — F17210 Nicotine dependence, cigarettes, uncomplicated: Secondary | ICD-10-CM

## 2016-08-07 DIAGNOSIS — H9201 Otalgia, right ear: Secondary | ICD-10-CM

## 2016-08-07 DIAGNOSIS — H6591 Unspecified nonsuppurative otitis media, right ear: Secondary | ICD-10-CM

## 2016-08-07 DIAGNOSIS — Z5321 Procedure and treatment not carried out due to patient leaving prior to being seen by health care provider: Secondary | ICD-10-CM | POA: Insufficient documentation

## 2016-08-07 DIAGNOSIS — F909 Attention-deficit hyperactivity disorder, unspecified type: Secondary | ICD-10-CM

## 2016-08-07 DIAGNOSIS — T50901A Poisoning by unspecified drugs, medicaments and biological substances, accidental (unintentional), initial encounter: Secondary | ICD-10-CM | POA: Diagnosis not present

## 2016-08-07 DIAGNOSIS — I469 Cardiac arrest, cause unspecified: Secondary | ICD-10-CM | POA: Diagnosis not present

## 2016-08-07 NOTE — ED Provider Notes (Signed)
[facility]-EMERGENCY DEPT Provider Note   CSN: [id number] Arrival date & time: 08/07/16  1810   By signing my name below, I, Teofilo Pod, attest that this documentation has been prepared under the direction and in the presence of Nira Conn, MD . Electronically Signed: Teofilo Pod, ED Scribe. 08/07/2016. 7:22 PM.   History   Chief Complaint Chief Complaint  Patient presents with  . Otalgia    The history is provided by the patient. No language interpreter was used.   HPI Comments:  Michelle [name] is a 36 y.o. female who presents to the Emergency Department complaining of constant right ear discomfort x 4 days. Pt complains of associated tinnitus in the right ear, rhinorrhea, and congestion. Pt was seen here on 08/03/16 and was diagnosed with sinusitis. She was treated here with antibiotics and steroids and had her last doses today. She reports no relief for right ear pain. She states that she had the same problems in the left ear but it has resolved. Pt denies ear discharge, fever. Sinus pain/pressure has resolved as well.  Past Medical History:  Diagnosis Date  . ADHD (attention deficit hyperactivity disorder)   . Bipolar 1 disorder, depressed (HCC)    pt feels stable- not on meds while pregnant  . Depression    not on meds while pregnant    Patient Active Problem List   Diagnosis Date Noted  . Previous cesarean delivery, antepartum condition or complication 03/20/2011    Past Surgical History:  Procedure Laterality Date  . BUNIONECTOMY    . CESAREAN SECTION     x 2  . DENTAL SURGERY    . HAND SURGERY    . TONSILLECTOMY    . TUBAL LIGATION      OB History    Gravida Para Term Preterm AB Living   0 1 3   SAB TAB Ectopic Multiple Live Births   1 0 0 0         Home Medications    Prior to Admission medications   Medication Sig Start Date End Date Taking? Authorizing Provider  azithromycin (ZITHROMAX) 250 MG tablet Take 1 tablet  (250 mg total) by mouth daily. Take 2 tabs on day one, then 1 tab daily until complete. 08/03/16   Deborha Payment, PA-C  meclizine (ANTIVERT) 25 MG tablet Take 1 tablet (25 mg total) by mouth 3 (three) times daily as needed for dizziness. 08/03/16 08/10/16  Deborha Payment, PA-C  medroxyPROGESTERone (DEPO-PROVERA) 150 MG/ML injection Inject 1 mL (150 mg total) into the muscle every 3 (three) months. 06/06/16   Brock Bad, MD  predniSONE (DELTASONE) 20 MG tablet Take 2 tablets (40 mg total) by mouth daily. 08/03/16 08/08/16  Deborha Payment, PA-C    Family History No family history on file.  Social History Social History  Substance Use Topics  . Smoking status: Current Every Day Smoker    Packs/day: 3.00    Types: Cigarettes  . Smokeless tobacco: Never Used  . Alcohol use Yes     Comment: rare     Allergies   Patient has no known allergies.   Review of Systems Review of Systems 10 Systems reviewed and are negative for acute change except as noted in the HPI.   Physical Exam Updated Vital Signs BP (!) 156/96   Pulse 68   Temp 98 F (36.7 C)   Resp 14   SpO2 99%   Physical Exam  Constitutional: She is oriented to person, place, and time. She appears well-developed and well-nourished. No distress.  HENT:  Head: Normocephalic and atraumatic.  Right Ear: No mastoid tenderness. Tympanic membrane is not injected and not erythematous. A middle ear effusion is present.  Left Ear: No mastoid tenderness. Tympanic membrane is not injected and not erythematous.  No middle ear effusion.  Nose: Mucosal edema present.  Eyes: Conjunctivae and EOM are normal. Pupils are equal, round, and reactive to light. Right eye exhibits no discharge. Left eye exhibits no discharge. No scleral icterus.  Neck: Normal range of motion. Neck supple.  Cardiovascular: Normal rate and regular rhythm.  Exam reveals no gallop and no friction rub.   No murmur heard. Pulmonary/Chest: Effort normal and breath sounds  normal. No stridor. No respiratory distress. She has no rales.  Abdominal: Soft. She exhibits no distension. There is no tenderness.  Musculoskeletal: She exhibits no edema or tenderness.  Neurological: She is alert and oriented to person, place, and time.  Skin: Skin is warm and dry. No rash noted. She is not diaphoretic. No erythema.  Psychiatric: She has a normal mood and affect.  Vitals reviewed.    ED Treatments / Results  DIAGNOSTIC STUDIES:  Oxygen Saturation is 99% on RA, normal by my interpretation.    COORDINATION OF CARE:  7:09 PM Discussed treatment plan with pt at bedside and pt agreed to plan.   Labs (all labs ordered are listed, but only abnormal results are displayed) Labs Reviewed - No data to display  EKG  EKG Interpretation None       Radiology No results found.  Procedures Procedures (including critical care time)  Medications Ordered in ED Medications - No data to display   Initial Impression / Assessment and Plan / ED Course  I have reviewed the triage vital signs and the nursing notes.  Pertinent labs & imaging results that were available during my care of the patient were reviewed by me and considered in my medical decision making (see chart for details).     Presentation consistent with right ear effusion without evidence of infection. This is likely secondary to the patient's recent URI/sinusitis which she has just completed antibiotic and steroid course. Discussed likely symptom duration and strict return precautions. No indication for additional workup at this time. She is safe for discharge.  Final Clinical Impressions(s) / ED Diagnoses   Final diagnoses:  Middle ear effusion, right   Disposition: Discharge  Condition: Good  I have discussed the results, Dx and Tx plan with the patient who expressed understanding and agree(s) with the plan. Discharge instructions discussed at great length. The patient was given strict return  precautions who verbalized understanding of the instructions. No further questions at time of discharge.    New Prescriptions   No medications on file    Follow Up: Primary care provider  Schedule an appointment as soon as possible for a visit in 2 weeks If symptoms do not improve or  worsen   I personally performed the services described in this documentation, which was scribed in my presence. The recorded information has been reviewed and is accurate.        Nira Conn, MD 08/07/16 [phone]

## 2016-08-07 NOTE — ED Notes (Signed)
Pt dropped her labels and armband off at NF desk and stated that she was leaving.

## 2016-08-07 NOTE — ED Triage Notes (Signed)
Pt triaged by this RN earlier this afternoon for right ear pain and difficulty hearing after being seen on 3/31. Pt left prior to seeing a provider earlier, stated she just did not want to sit here by herself but decided to return so she could be seen.

## 2016-08-07 NOTE — ED Triage Notes (Signed)
Pt reports diagnosed with sinusitis on 3/31, states she was given steroids but that she is having continued right ear pain and trouble hearing. Denies any other symptoms.

## 2016-08-09 ENCOUNTER — Inpatient Hospital Stay (HOSPITAL_COMMUNITY)
Admission: EM | Admit: 2016-08-09 | Discharge: 2016-09-03 | DRG: 917 | Disposition: E | Payer: Medicare Other | Attending: Pulmonary Disease | Admitting: Pulmonary Disease

## 2016-08-09 ENCOUNTER — Emergency Department (HOSPITAL_COMMUNITY): Payer: Medicare Other

## 2016-08-09 ENCOUNTER — Encounter (HOSPITAL_COMMUNITY): Payer: Self-pay

## 2016-08-09 DIAGNOSIS — R570 Cardiogenic shock: Secondary | ICD-10-CM | POA: Diagnosis present

## 2016-08-09 DIAGNOSIS — R64 Cachexia: Secondary | ICD-10-CM | POA: Diagnosis not present

## 2016-08-09 DIAGNOSIS — E875 Hyperkalemia: Secondary | ICD-10-CM | POA: Diagnosis present

## 2016-08-09 DIAGNOSIS — J690 Pneumonitis due to inhalation of food and vomit: Secondary | ICD-10-CM | POA: Diagnosis present

## 2016-08-09 DIAGNOSIS — E872 Acidosis, unspecified: Secondary | ICD-10-CM

## 2016-08-09 DIAGNOSIS — Z6837 Body mass index (BMI) 37.0-37.9, adult: Secondary | ICD-10-CM

## 2016-08-09 DIAGNOSIS — G936 Cerebral edema: Secondary | ICD-10-CM | POA: Diagnosis present

## 2016-08-09 DIAGNOSIS — J9601 Acute respiratory failure with hypoxia: Secondary | ICD-10-CM

## 2016-08-09 DIAGNOSIS — Z87820 Personal history of traumatic brain injury: Secondary | ICD-10-CM

## 2016-08-09 DIAGNOSIS — R402212 Coma scale, best verbal response, none, at arrival to emergency department: Secondary | ICD-10-CM | POA: Diagnosis present

## 2016-08-09 DIAGNOSIS — E669 Obesity, unspecified: Secondary | ICD-10-CM | POA: Diagnosis present

## 2016-08-09 DIAGNOSIS — R402112 Coma scale, eyes open, never, at arrival to emergency department: Secondary | ICD-10-CM | POA: Diagnosis present

## 2016-08-09 DIAGNOSIS — N179 Acute kidney failure, unspecified: Secondary | ICD-10-CM | POA: Diagnosis present

## 2016-08-09 DIAGNOSIS — G931 Anoxic brain damage, not elsewhere classified: Secondary | ICD-10-CM

## 2016-08-09 DIAGNOSIS — Z515 Encounter for palliative care: Secondary | ICD-10-CM

## 2016-08-09 DIAGNOSIS — Z9911 Dependence on respirator [ventilator] status: Secondary | ICD-10-CM

## 2016-08-09 DIAGNOSIS — T50901A Poisoning by unspecified drugs, medicaments and biological substances, accidental (unintentional), initial encounter: Principal | ICD-10-CM | POA: Diagnosis present

## 2016-08-09 DIAGNOSIS — R402312 Coma scale, best motor response, none, at arrival to emergency department: Secondary | ICD-10-CM | POA: Diagnosis present

## 2016-08-09 DIAGNOSIS — G253 Myoclonus: Secondary | ICD-10-CM | POA: Diagnosis present

## 2016-08-09 DIAGNOSIS — Z66 Do not resuscitate: Secondary | ICD-10-CM | POA: Diagnosis not present

## 2016-08-09 DIAGNOSIS — J9600 Acute respiratory failure, unspecified whether with hypoxia or hypercapnia: Secondary | ICD-10-CM

## 2016-08-09 DIAGNOSIS — Z7189 Other specified counseling: Secondary | ICD-10-CM

## 2016-08-09 DIAGNOSIS — I469 Cardiac arrest, cause unspecified: Secondary | ICD-10-CM

## 2016-08-09 LAB — I-STAT CHEM 8, ED
BUN: 22 mg/dL — AB (ref 6–20)
CALCIUM ION: 0.99 mmol/L — AB (ref 1.15–1.40)
CHLORIDE: 108 mmol/L (ref 101–111)
Creatinine, Ser: 2.7 mg/dL — ABNORMAL HIGH (ref 0.44–1.00)
GLUCOSE: 93 mg/dL (ref 65–99)
HCT: 43 % (ref 36.0–46.0)
Hemoglobin: 14.6 g/dL (ref 12.0–15.0)
POTASSIUM: 6.4 mmol/L — AB (ref 3.5–5.1)
Sodium: 139 mmol/L (ref 135–145)
TCO2: 18 mmol/L (ref 0–100)

## 2016-08-09 LAB — I-STAT CG4 LACTIC ACID, ED: LACTIC ACID, VENOUS: 9.18 mmol/L — AB (ref 0.5–1.9)

## 2016-08-09 LAB — COMPREHENSIVE METABOLIC PANEL
ALK PHOS: 69 U/L (ref 38–126)
ALT: 123 U/L — AB (ref 14–54)
AST: 119 U/L — AB (ref 15–41)
Albumin: 3.1 g/dL — ABNORMAL LOW (ref 3.5–5.0)
Anion gap: 17 — ABNORMAL HIGH (ref 5–15)
BILIRUBIN TOTAL: 0.3 mg/dL (ref 0.3–1.2)
BUN: 18 mg/dL (ref 6–20)
CALCIUM: 8 mg/dL — AB (ref 8.9–10.3)
CO2: 17 mmol/L — ABNORMAL LOW (ref 22–32)
CREATININE: 2.84 mg/dL — AB (ref 0.44–1.00)
Chloride: 106 mmol/L (ref 101–111)
GFR, EST AFRICAN AMERICAN: 24 mL/min — AB (ref >60)
GFR, EST NON AFRICAN AMERICAN: 20 mL/min — AB (ref >60)
Glucose, Bld: 96 mg/dL (ref 65–99)
Potassium: 6.6 mmol/L (ref 3.5–5.1)
Sodium: 140 mmol/L (ref 135–145)
Total Protein: 6.1 g/dL — ABNORMAL LOW (ref 6.5–8.1)

## 2016-08-09 LAB — CBC WITH DIFFERENTIAL/PLATELET
BASOS PCT: 0 %
Basophils Absolute: 0 10*3/uL (ref 0.0–0.1)
EOS ABS: 0 10*3/uL (ref 0.0–0.7)
EOS PCT: 0 %
HEMATOCRIT: 43.1 % (ref 36.0–46.0)
Hemoglobin: 13 g/dL (ref 12.0–15.0)
LYMPHS ABS: 3 10*3/uL (ref 0.7–4.0)
Lymphocytes Relative: 13 %
MCH: 27.6 pg (ref 26.0–34.0)
MCHC: 30.2 g/dL (ref 30.0–36.0)
MCV: 91.5 fL (ref 78.0–100.0)
MONO ABS: 0.9 10*3/uL (ref 0.1–1.0)
Monocytes Relative: 4 %
NEUTROS ABS: 19.5 10*3/uL — AB (ref 1.7–7.7)
Neutrophils Relative %: 83 %
PLATELETS: 269 10*3/uL (ref 150–400)
RBC: 4.71 MIL/uL (ref 3.87–5.11)
RDW: 15.1 % (ref 11.5–15.5)
WBC: 23.4 10*3/uL — ABNORMAL HIGH (ref 4.0–10.5)

## 2016-08-09 LAB — ETHANOL: Alcohol, Ethyl (B): <5 mg/dL (ref <5)

## 2016-08-09 LAB — I-STAT TROPONIN, ED: TROPONIN I, POC: 0.01 ng/mL (ref 0.00–0.08)

## 2016-08-09 LAB — ACETAMINOPHEN LEVEL

## 2016-08-09 LAB — I-STAT BETA HCG BLOOD, ED (MC, WL, AP ONLY): I-stat hCG, quantitative: <5 m[IU]/mL (ref <5)

## 2016-08-09 LAB — SALICYLATE LEVEL

## 2016-08-09 MED ORDER — EPINEPHRINE PF 1 MG/ML IJ SOLN
0.5000 ug/min | INTRAMUSCULAR | Status: DC
  Administered 2016-08-09: 0.5 ug/min via INTRAVENOUS
  Administered 2016-08-10: 19 ug/min via INTRAVENOUS
  Filled 2016-08-09 (×3): qty 4

## 2016-08-09 MED ORDER — PROPOFOL 1000 MG/100ML IV EMUL
INTRAVENOUS | Status: AC | PRN
  Administered 2016-08-09: 10 ug/kg/min via INTRAVENOUS

## 2016-08-09 MED ORDER — SODIUM CHLORIDE 0.9 % IV SOLN
INTRAVENOUS | Status: AC | PRN
  Administered 2016-08-09 – 2016-08-10 (×3): 1000 mL via INTRAVENOUS

## 2016-08-09 MED ORDER — NALOXONE HCL 2 MG/2ML IJ SOSY
PREFILLED_SYRINGE | INTRAMUSCULAR | Status: AC | PRN
  Administered 2016-08-09: 2 mg via INTRAVENOUS

## 2016-08-09 NOTE — Code Documentation (Signed)
POrtable in room 

## 2016-08-09 NOTE — Code Documentation (Signed)
Pt escorted to CT by this Rn, [place], Resp tech and EMT.

## 2016-08-09 NOTE — Code Documentation (Signed)
Pt back from CT

## 2016-08-09 NOTE — ED Notes (Signed)
A copy of chem 8 results and lactic acid results 9.18 given to Dr Eudelia Bunch
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
## 2016-08-10 ENCOUNTER — Inpatient Hospital Stay (HOSPITAL_COMMUNITY): Payer: Medicare Other

## 2016-08-10 DIAGNOSIS — J9600 Acute respiratory failure, unspecified whether with hypoxia or hypercapnia: Secondary | ICD-10-CM | POA: Diagnosis not present

## 2016-08-10 DIAGNOSIS — R570 Cardiogenic shock: Secondary | ICD-10-CM | POA: Diagnosis present

## 2016-08-10 DIAGNOSIS — I469 Cardiac arrest, cause unspecified: Secondary | ICD-10-CM | POA: Diagnosis present

## 2016-08-10 DIAGNOSIS — T50901A Poisoning by unspecified drugs, medicaments and biological substances, accidental (unintentional), initial encounter: Secondary | ICD-10-CM | POA: Diagnosis present

## 2016-08-10 DIAGNOSIS — Z515 Encounter for palliative care: Secondary | ICD-10-CM | POA: Diagnosis not present

## 2016-08-10 DIAGNOSIS — R64 Cachexia: Secondary | ICD-10-CM | POA: Diagnosis not present

## 2016-08-10 DIAGNOSIS — Z87820 Personal history of traumatic brain injury: Secondary | ICD-10-CM | POA: Diagnosis not present

## 2016-08-10 DIAGNOSIS — E669 Obesity, unspecified: Secondary | ICD-10-CM | POA: Diagnosis present

## 2016-08-10 DIAGNOSIS — Z6837 Body mass index (BMI) 37.0-37.9, adult: Secondary | ICD-10-CM | POA: Diagnosis not present

## 2016-08-10 DIAGNOSIS — G931 Anoxic brain damage, not elsewhere classified: Secondary | ICD-10-CM | POA: Diagnosis present

## 2016-08-10 DIAGNOSIS — G936 Cerebral edema: Secondary | ICD-10-CM | POA: Diagnosis present

## 2016-08-10 DIAGNOSIS — J690 Pneumonitis due to inhalation of food and vomit: Secondary | ICD-10-CM | POA: Diagnosis present

## 2016-08-10 DIAGNOSIS — G253 Myoclonus: Secondary | ICD-10-CM | POA: Diagnosis present

## 2016-08-10 DIAGNOSIS — R402431 Glasgow coma scale score 3-8, in the field [EMT or ambulance]: Secondary | ICD-10-CM

## 2016-08-10 DIAGNOSIS — J9601 Acute respiratory failure with hypoxia: Secondary | ICD-10-CM | POA: Diagnosis present

## 2016-08-10 DIAGNOSIS — Z66 Do not resuscitate: Secondary | ICD-10-CM | POA: Diagnosis not present

## 2016-08-10 DIAGNOSIS — E875 Hyperkalemia: Secondary | ICD-10-CM | POA: Diagnosis present

## 2016-08-10 DIAGNOSIS — R402112 Coma scale, eyes open, never, at arrival to emergency department: Secondary | ICD-10-CM | POA: Diagnosis present

## 2016-08-10 DIAGNOSIS — E872 Acidosis: Secondary | ICD-10-CM | POA: Diagnosis present

## 2016-08-10 DIAGNOSIS — Z7189 Other specified counseling: Secondary | ICD-10-CM | POA: Diagnosis not present

## 2016-08-10 DIAGNOSIS — N179 Acute kidney failure, unspecified: Secondary | ICD-10-CM | POA: Diagnosis present

## 2016-08-10 DIAGNOSIS — R402212 Coma scale, best verbal response, none, at arrival to emergency department: Secondary | ICD-10-CM | POA: Diagnosis present

## 2016-08-10 DIAGNOSIS — R402312 Coma scale, best motor response, none, at arrival to emergency department: Secondary | ICD-10-CM | POA: Diagnosis present

## 2016-08-10 LAB — RAPID URINE DRUG SCREEN, HOSP PERFORMED
Amphetamines: NOT DETECTED
Barbiturates: NOT DETECTED
Benzodiazepines: NOT DETECTED
Cocaine: NOT DETECTED
OPIATES: NOT DETECTED
Tetrahydrocannabinol: NOT DETECTED

## 2016-08-10 LAB — URINALYSIS, ROUTINE W REFLEX MICROSCOPIC
BILIRUBIN URINE: NEGATIVE
Glucose, UA: 50 mg/dL — AB
Ketones, ur: NEGATIVE mg/dL
LEUKOCYTES UA: NEGATIVE
Nitrite: NEGATIVE
PROTEIN: 100 mg/dL — AB
Specific Gravity, Urine: 1.008 (ref 1.005–1.030)
pH: 6 (ref 5.0–8.0)

## 2016-08-10 LAB — BASIC METABOLIC PANEL
ANION GAP: 19 — AB (ref 5–15)
BUN: 20 mg/dL (ref 6–20)
CALCIUM: 7 mg/dL — AB (ref 8.9–10.3)
CO2: 14 mmol/L — ABNORMAL LOW (ref 22–32)
Chloride: 108 mmol/L (ref 101–111)
Creatinine, Ser: 2.41 mg/dL — ABNORMAL HIGH (ref 0.44–1.00)
GFR, EST AFRICAN AMERICAN: 29 mL/min — AB (ref >60)
GFR, EST NON AFRICAN AMERICAN: 25 mL/min — AB (ref >60)
GLUCOSE: 240 mg/dL — AB (ref 65–99)
Potassium: 4.4 mmol/L (ref 3.5–5.1)
SODIUM: 141 mmol/L (ref 135–145)

## 2016-08-10 LAB — I-STAT ARTERIAL BLOOD GAS, ED
ACID-BASE DEFICIT: 14 mmol/L — AB (ref 0.0–2.0)
Bicarbonate: 13.8 mmol/L — ABNORMAL LOW (ref 20.0–28.0)
O2 SAT: 100 %
PH ART: 7.173 — AB (ref 7.350–7.450)
TCO2: 15 mmol/L (ref 0–100)
pCO2 arterial: 37.5 mmHg (ref 32.0–48.0)
pO2, Arterial: 262 mmHg — ABNORMAL HIGH (ref 83.0–108.0)

## 2016-08-10 LAB — CBC
HCT: 44.2 % (ref 36.0–46.0)
Hemoglobin: 13.6 g/dL (ref 12.0–15.0)
MCH: 27.5 pg (ref 26.0–34.0)
MCHC: 30.8 g/dL (ref 30.0–36.0)
MCV: 89.5 fL (ref 78.0–100.0)
PLATELETS: 292 10*3/uL (ref 150–400)
RBC: 4.94 MIL/uL (ref 3.87–5.11)
RDW: 15 % (ref 11.5–15.5)
WBC: 28.5 10*3/uL — AB (ref 4.0–10.5)

## 2016-08-10 LAB — MRSA PCR SCREENING: MRSA BY PCR: NEGATIVE

## 2016-08-10 LAB — PHOSPHORUS: PHOSPHORUS: 7 mg/dL — AB (ref 2.5–4.6)

## 2016-08-10 LAB — MAGNESIUM: MAGNESIUM: 2.1 mg/dL (ref 1.7–2.4)

## 2016-08-10 LAB — HIV ANTIBODY (ROUTINE TESTING W REFLEX): HIV SCREEN 4TH GENERATION: NONREACTIVE

## 2016-08-10 LAB — LACTIC ACID, PLASMA
LACTIC ACID, VENOUS: 6.4 mmol/L — AB (ref 0.5–1.9)
Lactic Acid, Venous: 4.9 mmol/L (ref 0.5–1.9)

## 2016-08-10 MED ORDER — ORAL CARE MOUTH RINSE
15.0000 mL | OROMUCOSAL | Status: DC
  Administered 2016-08-10 – 2016-08-24 (×143): 15 mL via OROMUCOSAL

## 2016-08-10 MED ORDER — NOREPINEPHRINE BITARTRATE 1 MG/ML IV SOLN
0.0000 ug/min | INTRAVENOUS | Status: DC
  Administered 2016-08-10: 2 ug/min via INTRAVENOUS
  Filled 2016-08-10: qty 4

## 2016-08-10 MED ORDER — CHLORHEXIDINE GLUCONATE 0.12% ORAL RINSE (MEDLINE KIT)
15.0000 mL | Freq: Two times a day (BID) | OROMUCOSAL | Status: DC
  Administered 2016-08-10 – 2016-08-24 (×29): 15 mL via OROMUCOSAL

## 2016-08-10 MED ORDER — HEPARIN SODIUM (PORCINE) 5000 UNIT/ML IJ SOLN
5000.0000 [IU] | Freq: Three times a day (TID) | INTRAMUSCULAR | Status: DC
  Administered 2016-08-10 – 2016-08-24 (×43): 5000 [IU] via SUBCUTANEOUS
  Filled 2016-08-10 (×40): qty 1

## 2016-08-10 MED ORDER — PANTOPRAZOLE SODIUM 40 MG IV SOLR
40.0000 mg | INTRAVENOUS | Status: DC
  Administered 2016-08-10 – 2016-08-19 (×10): 40 mg via INTRAVENOUS
  Filled 2016-08-10 (×10): qty 40

## 2016-08-10 MED ORDER — SODIUM CHLORIDE 0.9 % IV SOLN
1000.0000 mg | Freq: Once | INTRAVENOUS | Status: AC
  Administered 2016-08-10: 1000 mg via INTRAVENOUS
  Filled 2016-08-10: qty 10

## 2016-08-10 MED ORDER — SODIUM CHLORIDE 0.9 % IV SOLN
250.0000 mL | INTRAVENOUS | Status: DC | PRN
  Administered 2016-08-20: 10 mL via INTRAVENOUS

## 2016-08-10 MED ORDER — LORAZEPAM 2 MG/ML IJ SOLN
INTRAMUSCULAR | Status: AC
  Administered 2016-08-10: 2 mg
  Filled 2016-08-10: qty 1

## 2016-08-10 NOTE — Progress Notes (Signed)
EEG completed, results pending. 

## 2016-08-10 NOTE — ED Notes (Signed)
Pt's family in room with this RN and Dr Eugenia Pancoast.

## 2016-08-10 NOTE — Code Documentation (Signed)
Pt having occasional jerking motions that appear to be seizure like activity.
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
## 2016-08-10 NOTE — ED Notes (Signed)
Unsuccessful blood draw QNS,  Nurse will try to pull blood from IV.

## 2016-08-10 NOTE — Progress Notes (Signed)
Nutrition Brief Note  Chart reviewed. Pt s/p cardiac arrest, profound hypoxic/ishemic injury. Plan is for 24-48 assessment, possible withdrawal of care. No nutrition interventions warranted at this time.  Please consult as needed.   Maureen Chatters, RD, LDN Pager #: [phone] After-Hours Pager #: [phone]

## 2016-08-10 NOTE — ED Notes (Addendum)
BP 80/62, MD notified and ordered to turn off propofol. Epi drip at 20 now

## 2016-08-10 NOTE — Progress Notes (Signed)
Dr. Delton Coombes notified of temp of 101.5.  Orders received.  Will continue to monitor closely.

## 2016-08-10 NOTE — Code Documentation (Signed)
Pt seizing more frequently now. MD aware.

## 2016-08-10 NOTE — ED Notes (Signed)
Phlebotomy in room. 

## 2016-08-10 NOTE — Procedures (Signed)
Electroencephalogram (EEG) Report  Date of study: 08/10/16  Requesting clinician: Baltazar Apo, MD  Reason for study: Evaluate for seizure  Brief clinical history: 4-yo woman admitted for cardiac arrest after an apparent overdose. EEG is requested for further evaluation.   Medications:  Current Facility-Administered Medications:  .  0.9 %  sodium chloride infusion, 250 mL, Intravenous, PRN, Luz Brazen, MD, Last Rate: 10 mL/hr at 08/10/16 0300, 250 mL at 08/10/16 0300 .  chlorhexidine gluconate (MEDLINE KIT) (PERIDEX) 0.12 % solution 15 mL, 15 mL, Mouth Rinse, BID, Jose Shirl Harris, MD, 15 mL at 08/10/16 0802 .  EPINEPHrine (ADRENALIN) 4 mg in dextrose 5 % 250 mL (0.016 mg/mL) infusion, 0.5-20 mcg/min, Intravenous, Titrated, Fatima Blank, MD, Stopped at 08/10/16 0905 .  heparin injection 5,000 Units, 5,000 Units, Subcutaneous, Q8H, Luz Brazen, MD, 5,000 Units at 08/10/16 0645 .  MEDLINE mouth rinse, 15 mL, Mouth Rinse, 10 times per day, Rush Landmark, MD, 15 mL at 08/10/16 [phone] .  norepinephrine (LEVOPHED) 4 mg in dextrose 5 % 250 mL (0.016 mg/mL) infusion, 0-50 mcg/min, Intravenous, Titrated, Collene Gobble, MD, Last Rate: 7.5 mL/hr at 08/10/16 0856, 2 mcg/min at 08/10/16 0856 .  pantoprazole (PROTONIX) injection 40 mg, 40 mg, Intravenous, Q24H, Colbert Coyer, MD, 40 mg at 08/10/16 5107  Description: This is a routine EEG performed using standard international 10-20 electrode placement. A total of 18 channels are recorded, including one for the EKG. The patient is intubated and unresponsive in the ICU during this recording. According to notes, she is not receiving any sedation. She is not hypothermic.  Activating Maneuvers: None  Findings:  The EKG channel demonstrates a regular rhythm with a rate of 80 beats per minute.   This recording demonstrates moderately reduced voltages. The background consists of burst suppression. Bursts are comprised of  moderate voltage delta activity with a frequency of about 2-3 hertz. There are interspersed periods of suppression lasting up to 2 seconds. This recording shows no spontaneous reactivity. There is no reactivity to noxious stimulation or to eye opening.  There are no focal asymmetries. No epileptiform discharges are present. No seizures are recorded.    Impression:  This is a markedly abnormal EEG due to the presence of burst suppression. There is no evidence of seizure activity.  Clinical correlation: This pattern is consistent with severe global cerebral dysfunction. 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 In the absence of sedation, this pattern is associated with a poor prognosis for meaningful neurological recovery.   Melba Coon, MD [facility]

## 2016-08-10 NOTE — Progress Notes (Addendum)
Pt's Parole officer called this am.  The following information was offered by [employer]:  Pt is on disability for mental retardation, has had weekly UDS (which has been negative each week), 2 days ago pt called officer stating she was suicidal.  Pt reached out to appropriate help for her suicidal thoughts.  Pt's parole officer found it very uncharacteristic of pt to be doing drugs.  Officer has known pt for a lengthy amount of time.

## 2016-08-10 NOTE — Progress Notes (Signed)
   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  Checked in at the desk,no information about the patient.  Took family up to [facility] waiting room, while pt was transferred.  Waited with family for a few minutes until pt was settled in room Chaplain rendered a personal prayer with family, 3 Psalm of Bubba Hales, emotional support, ministry of presence, active listening. Chaplain Tavionna Grout A.Soriya Worster ,MA-PC, BA-REL/PHIL, [phone]
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
## 2016-08-10 NOTE — ED Notes (Signed)
Critical Care in room.  
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
## 2016-08-10 NOTE — Progress Notes (Signed)
Dr. Delton Coombes at bedside and notified of LA result.
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
## 2016-08-11 ENCOUNTER — Inpatient Hospital Stay (HOSPITAL_COMMUNITY): Payer: Medicare Other

## 2016-08-11 DIAGNOSIS — J9601 Acute respiratory failure with hypoxia: Secondary | ICD-10-CM

## 2016-08-11 LAB — BASIC METABOLIC PANEL
Anion gap: 13 (ref 5–15)
BUN: 12 mg/dL (ref 6–20)
CHLORIDE: 107 mmol/L (ref 101–111)
CO2: 18 mmol/L — ABNORMAL LOW (ref 22–32)
CREATININE: 1.19 mg/dL — AB (ref 0.44–1.00)
Calcium: 8.6 mg/dL — ABNORMAL LOW (ref 8.9–10.3)
GFR calc Af Amer: >60 mL/min (ref >60)
GFR calc non Af Amer: 58 mL/min — ABNORMAL LOW (ref >60)
Glucose, Bld: 98 mg/dL (ref 65–99)
Potassium: 4.6 mmol/L (ref 3.5–5.1)
SODIUM: 138 mmol/L (ref 135–145)

## 2016-08-11 LAB — LACTIC ACID, PLASMA: Lactic Acid, Venous: 2.1 mmol/L (ref 0.5–1.9)

## 2016-08-11 LAB — URINE CULTURE: Culture: NO GROWTH

## 2016-08-11 LAB — MAGNESIUM: MAGNESIUM: 1.8 mg/dL (ref 1.7–2.4)

## 2016-08-11 MED ORDER — ACETAMINOPHEN 650 MG RE SUPP
650.0000 mg | Freq: Four times a day (QID) | RECTAL | Status: DC | PRN
  Administered 2016-08-11 – 2016-08-12 (×2): 650 mg via RECTAL
  Filled 2016-08-11 (×3): qty 1

## 2016-08-11 MED ORDER — LABETALOL HCL 5 MG/ML IV SOLN
20.0000 mg | INTRAVENOUS | Status: DC | PRN
  Administered 2016-08-11: 20 mg via INTRAVENOUS
  Filled 2016-08-11: qty 4

## 2016-08-11 MED ORDER — FENTANYL CITRATE (PF) 100 MCG/2ML IJ SOLN
25.0000 ug | INTRAMUSCULAR | Status: DC | PRN
  Administered 2016-08-11: 50 ug via INTRAVENOUS
  Administered 2016-08-11: 25 ug via INTRAVENOUS
  Administered 2016-08-12 – 2016-08-16 (×8): 50 ug via INTRAVENOUS
  Filled 2016-08-11 (×9): qty 2

## 2016-08-11 MED ORDER — SODIUM CHLORIDE 0.9 % IV BOLUS (SEPSIS)
1000.0000 mL | Freq: Once | INTRAVENOUS | Status: AC
  Administered 2016-08-11: 1000 mL via INTRAVENOUS

## 2016-08-11 MED ORDER — LABETALOL HCL 5 MG/ML IV SOLN
INTRAVENOUS | Status: AC
  Filled 2016-08-11: qty 4

## 2016-08-11 NOTE — Progress Notes (Signed)
Dr. Dellie Catholic notified of urine output for shift.  Orders received.  Will continue to monitor closely.

## 2016-08-11 NOTE — Progress Notes (Signed)
eLink Physician-Brief Progress Note Patient Name: Michelle [name] DOB: 1981/04/23 MRN: [id number]   Date of Service  08/11/2016  HPI/Events of Note  Oliguria.   eICU Interventions  Will bolus with 0.9 NaCl 1 liter IV over 1 hour now.      Intervention Category Intermediate Interventions: Oliguria - evaluation and management  [name] 08/11/2016, 6:21 PM

## 2016-08-11 NOTE — Progress Notes (Signed)
E-link notified of 02 sat remaining 84-90% despite 100% Fi02  and ETT/oral suctioning. Will continue to monitor.

## 2016-08-11 NOTE — Progress Notes (Signed)
eLink Physician-Brief Progress Note Patient Name: Michelle [name] DOB: 1981/02/11 MRN: [id number]   Date of Service  08/11/2016  HPI/Events of Note  Temp of 101.7 with order for cooling blanket.  Some liver injury following code.  eICU Interventions  q6 hour PRN tylenol supp for fever     Intervention Category Minor Interventions: Routine modifications to care plan (e.g. PRN medications for pain, fever)  DETERDING,ELIZABETH 08/11/2016, 12:25 AM
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
## 2016-08-12 ENCOUNTER — Inpatient Hospital Stay (HOSPITAL_COMMUNITY): Payer: Medicare Other

## 2016-08-12 LAB — BASIC METABOLIC PANEL
Anion gap: 15 (ref 5–15)
BUN: 12 mg/dL (ref 6–20)
CO2: 16 mmol/L — AB (ref 22–32)
Calcium: 8.6 mg/dL — ABNORMAL LOW (ref 8.9–10.3)
Chloride: 109 mmol/L (ref 101–111)
Creatinine, Ser: 1.06 mg/dL — ABNORMAL HIGH (ref 0.44–1.00)
GFR calc Af Amer: >60 mL/min (ref >60)
GFR calc non Af Amer: >60 mL/min (ref >60)
GLUCOSE: 94 mg/dL (ref 65–99)
POTASSIUM: 3.9 mmol/L (ref 3.5–5.1)
Sodium: 140 mmol/L (ref 135–145)

## 2016-08-12 LAB — CBC
HEMATOCRIT: 42 % (ref 36.0–46.0)
Hemoglobin: 13.6 g/dL (ref 12.0–15.0)
MCH: 27.5 pg (ref 26.0–34.0)
MCHC: 32.4 g/dL (ref 30.0–36.0)
MCV: 84.8 fL (ref 78.0–100.0)
PLATELETS: 199 10*3/uL (ref 150–400)
RBC: 4.95 MIL/uL (ref 3.87–5.11)
RDW: 14.3 % (ref 11.5–15.5)
WBC: 20.4 10*3/uL — ABNORMAL HIGH (ref 4.0–10.5)

## 2016-08-12 LAB — GLUCOSE, CAPILLARY: Glucose-Capillary: 81 mg/dL (ref 65–99)

## 2016-08-12 MED ORDER — ACETAMINOPHEN 160 MG/5ML PO SOLN
650.0000 mg | Freq: Four times a day (QID) | ORAL | Status: DC | PRN
  Administered 2016-08-12 – 2016-08-22 (×7): 650 mg
  Filled 2016-08-12 (×7): qty 20.3

## 2016-08-12 MED ORDER — SODIUM CHLORIDE 0.9 % IV SOLN
3.0000 g | Freq: Four times a day (QID) | INTRAVENOUS | Status: DC
  Administered 2016-08-12 – 2016-08-19 (×29): 3 g via INTRAVENOUS
  Filled 2016-08-12 (×31): qty 3
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
## 2016-08-12 NOTE — Progress Notes (Signed)
EEG completed, results pending. 
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
## 2016-08-12 NOTE — Progress Notes (Signed)
Rt transported pt to and from 2H23 to CT1 on ventilator. Pt stable throughout with no complications. VS within normal limits. RT will continue to monitor.

## 2016-08-12 NOTE — Procedures (Signed)
EEG Report  Date 08/12/16  Referring physician- Jonna Munro  Reason for the study 36 year old female found down with cardiac arrest, currently on ventilatory support continues to remain unresponsive  Technical This is a multichannel digital EEG recording using the international 10-20 placement system.   Description of the recording. EEG comprised of generalized generalized 2-3 Hz delta slowing with occasional intermittent burst suppression and theta 4-5 Hz activity. Generalized muscle artifact. Patient had some spontaneous movement sometime body jerks which were not associated with any epileptiform features. Very mild but positive EEG reactivity seen. Sleep architecture was not observed.  Impression This EEG is abnormal and findings are consistent with  moderate to severe generalized cerebral dysfunction with very mild EEG reactivity. Epileptiform features were not seen during this recording

## 2016-08-12 NOTE — Progress Notes (Signed)
   08/12/16 2323  Respiratory Severity Assessment  Respiratory History 0  Breath Sounds 1  Respiratory Pattern 1  Cough 0  Chest X Ray 0  O2 Requirements 3  Level of Activity 2  Dyspnea 0  Score Total 7  Respiratory Therapy Follow Up Assessment  Assessment follow up date 08/13/16   Patient's assessment score is 7 due to mechanical ventilation. Respiratory treatments not indicated at this time.
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
## 2016-08-12 NOTE — Progress Notes (Signed)
PCCM INTERVAL PROGRESS NOTE  Long discussion with Mrs. Levada Schilling (patient's mother) about course thus far and expected poor prognosis. While she understands that her daughter is very sick and very probably has suffered a severe anoxic brain injury, she is praying for a miracle and as expecting one to happen.   Joneen Roach, AGACNP-BC [facility] Pulmonology/Critical Care Pager [phone] or [phone]  08/12/2016 12:07 PM

## 2016-08-12 NOTE — Progress Notes (Signed)
   08/12/16 1108  Clinical Encounter Type  Visited With Patient and family together  Visit Type Follow-up  Referral From Other (Comment) (Rounding visit)  Consult/Referral To Chaplain  Spiritual Encounters  Spiritual Needs Prayer;Emotional  Stress Factors  Patient Stress Factors Major life changes  Rounding, follow up with patient and family.  Prayer and emotional support. Will follow up as needed.   Chaplain Devonta Blanford A.Creg Gilmer, MA-PC, BA-REL/PHIL, [phone]

## 2016-08-13 ENCOUNTER — Inpatient Hospital Stay (HOSPITAL_COMMUNITY): Payer: Medicare Other

## 2016-08-13 DIAGNOSIS — G931 Anoxic brain damage, not elsewhere classified: Secondary | ICD-10-CM

## 2016-08-13 LAB — CBC
HCT: 41.5 % (ref 36.0–46.0)
HEMOGLOBIN: 13.5 g/dL (ref 12.0–15.0)
MCH: 27.6 pg (ref 26.0–34.0)
MCHC: 32.5 g/dL (ref 30.0–36.0)
MCV: 84.7 fL (ref 78.0–100.0)
Platelets: 201 10*3/uL (ref 150–400)
RBC: 4.9 MIL/uL (ref 3.87–5.11)
RDW: 14.3 % (ref 11.5–15.5)
WBC: 21.8 10*3/uL — ABNORMAL HIGH (ref 4.0–10.5)

## 2016-08-13 LAB — BASIC METABOLIC PANEL
ANION GAP: 16 — AB (ref 5–15)
BUN: 11 mg/dL (ref 6–20)
CALCIUM: 9 mg/dL (ref 8.9–10.3)
CO2: 16 mmol/L — AB (ref 22–32)
Chloride: 108 mmol/L (ref 101–111)
Creatinine, Ser: 0.94 mg/dL (ref 0.44–1.00)
GFR calc non Af Amer: >60 mL/min (ref >60)
Glucose, Bld: 70 mg/dL (ref 65–99)
Potassium: 3.7 mmol/L (ref 3.5–5.1)
Sodium: 140 mmol/L (ref 135–145)

## 2016-08-13 LAB — GLUCOSE, CAPILLARY
GLUCOSE-CAPILLARY: 105 mg/dL — AB (ref 65–99)
GLUCOSE-CAPILLARY: 140 mg/dL — AB (ref 65–99)
GLUCOSE-CAPILLARY: 163 mg/dL — AB (ref 65–99)
Glucose-Capillary: 126 mg/dL — ABNORMAL HIGH (ref 65–99)
Glucose-Capillary: 81 mg/dL (ref 65–99)

## 2016-08-13 LAB — POCT I-STAT 3, ART BLOOD GAS (G3+)
ACID-BASE DEFICIT: 7 mmol/L — AB (ref 0.0–2.0)
BICARBONATE: 15.7 mmol/L — AB (ref 20.0–28.0)
O2 Saturation: 93 %
PCO2 ART: 25.4 mmHg — AB (ref 32.0–48.0)
PO2 ART: 69 mmHg — AB (ref 83.0–108.0)
Patient temperature: 37.6
TCO2: 16 mmol/L (ref 0–100)
pH, Arterial: 7.401 (ref 7.350–7.450)

## 2016-08-13 LAB — MAGNESIUM
MAGNESIUM: 2 mg/dL (ref 1.7–2.4)
Magnesium: 2 mg/dL (ref 1.7–2.4)

## 2016-08-13 LAB — PHOSPHORUS
PHOSPHORUS: 1.9 mg/dL — AB (ref 2.5–4.6)
PHOSPHORUS: 1.5 mg/dL — AB (ref 2.5–4.6)

## 2016-08-13 MED ORDER — VITAL HIGH PROTEIN PO LIQD
1000.0000 mL | ORAL | Status: DC
  Administered 2016-08-13 (×3)
  Administered 2016-08-13 – 2016-08-14 (×4): 1000 mL
  Administered 2016-08-15 (×4)
  Administered 2016-08-15: 1000 mL
  Administered 2016-08-15: 22:00:00
  Administered 2016-08-16: 1000 mL
  Administered 2016-08-16 – 2016-08-17 (×13)
  Administered 2016-08-17: 1000 mL
  Administered 2016-08-17 (×7)
  Administered 2016-08-18: 1000 mL
  Administered 2016-08-18 – 2016-08-19 (×13)
  Administered 2016-08-19 – 2016-08-23 (×5): 1000 mL
  Administered 2016-08-24 (×2)

## 2016-08-13 MED ORDER — PRO-STAT SUGAR FREE PO LIQD
30.0000 mL | Freq: Two times a day (BID) | ORAL | Status: DC
  Administered 2016-08-13 – 2016-08-24 (×23): 30 mL
  Filled 2016-08-13 (×23): qty 30

## 2016-08-13 MED ORDER — ADULT MULTIVITAMIN LIQUID CH
15.0000 mL | Freq: Every day | ORAL | Status: DC
  Administered 2016-08-13 – 2016-08-24 (×12): 15 mL
  Filled 2016-08-13 (×12): qty 15

## 2016-08-13 NOTE — Progress Notes (Signed)
No charge note:  Palliative Medicine Note:  Palliative medicine meeting planned for tomorrow at 11am with patient's mother and her husband.  Ocie Bob, AGNP-C Palliative Medicine  Please call Palliative Medicine team phone with any questions [phone]. For individual providers please see AMION.

## 2016-08-13 NOTE — Progress Notes (Signed)
Initial Nutrition Assessment  DOCUMENTATION CODES:   Obesity unspecified  INTERVENTION:  Continue TF via OGT with Vital High Protein at goal rate of 40 ml/h (960 ml per day) and Prostat 30 ml BID to provide 1160 kcals, 114 gm protein, 806 ml free water daily.  Provide liquid MVI per tube daily.   NUTRITION DIAGNOSIS:   Inadequate oral intake related to inability to eat as evidenced by estimated needs.  GOAL:   Provide needs based on ASPEN/SCCM guidelines  MONITOR:   Vent status, Labs, Weight trends, TF tolerance, Skin, I & O's  REASON FOR ASSESSMENT:   Consult Enteral/tube feeding initiation and management  ASSESSMENT:   36 y/o woman with cardiac arrest. Etiology unclear but suspect toxic ingestion, although UDS negative. Likely has suffered significant anoxic injury. Course complicated by hypoxemia, fevers, possible aspiration. Repeat imaging concerning for anoxic injury.   Patient is currently intubated on ventilator support MV: 14.5 L/min Temp (24hrs), Avg:100.2 F (37.9 C), Min:95 F (35 C), Max:101.8 F (38.8 C)  Propofol: none  RD consulted for enteral/tube feeding initiation and management. Per MD note, doubt neurological status will improve to where she can be weaned for extubation. Pt with poor prognosis. Palliative care has been consulted. Per MD note, family hoping for a miracle. No family at bedside. Pt with no observed significant fat or muscle mass loss.   Labs and medications reviewed. Phosphorous low at 1.9.  Diet Order:  Diet NPO time specified  Skin:  Reviewed, no issues  Last BM:  4/7  Height:   Ht Readings from Last 1 Encounters:  08/10/16  (1.626 m)    Weight:   Wt Readings from Last 1 Encounters:  08/13/16 229 lb 4.5 oz (104 kg)  Admit weight 08-11-16 200 lb (90.7 kg)  Ideal Body Weight:  54.5 kg  BMI:  Body mass index is 39.36 kg/m.  Estimated Nutritional Needs:   Kcal:  [id number]  Protein:  >/= 109 grams  Fluid:  Per  MD  EDUCATION NEEDS:   No education needs identified at this time  Roslyn Smiling, MS, RD, LDN Pager # [phone] After hours/ weekend pager # [phone]
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
## 2016-08-14 DIAGNOSIS — Z7189 Other specified counseling: Secondary | ICD-10-CM

## 2016-08-14 DIAGNOSIS — J9601 Acute respiratory failure with hypoxia: Secondary | ICD-10-CM

## 2016-08-14 DIAGNOSIS — G931 Anoxic brain damage, not elsewhere classified: Secondary | ICD-10-CM

## 2016-08-14 DIAGNOSIS — Z515 Encounter for palliative care: Secondary | ICD-10-CM

## 2016-08-14 LAB — PHOSPHORUS
Phosphorus: 1.2 mg/dL — ABNORMAL LOW (ref 2.5–4.6)
Phosphorus: 1.7 mg/dL — ABNORMAL LOW (ref 2.5–4.6)

## 2016-08-14 LAB — CULTURE, RESPIRATORY W GRAM STAIN: Culture: NORMAL

## 2016-08-14 LAB — COMPREHENSIVE METABOLIC PANEL
ALT: 50 U/L (ref 14–54)
ANION GAP: 12 (ref 5–15)
AST: 62 U/L — ABNORMAL HIGH (ref 15–41)
Albumin: 2.3 g/dL — ABNORMAL LOW (ref 3.5–5.0)
Alkaline Phosphatase: 61 U/L (ref 38–126)
BUN: 14 mg/dL (ref 6–20)
CHLORIDE: 107 mmol/L (ref 101–111)
CO2: 23 mmol/L (ref 22–32)
CREATININE: 0.83 mg/dL (ref 0.44–1.00)
Calcium: 8.7 mg/dL — ABNORMAL LOW (ref 8.9–10.3)
Glucose, Bld: 114 mg/dL — ABNORMAL HIGH (ref 65–99)
Potassium: 3.2 mmol/L — ABNORMAL LOW (ref 3.5–5.1)
Sodium: 142 mmol/L (ref 135–145)
Total Bilirubin: 0.6 mg/dL (ref 0.3–1.2)
Total Protein: 6.3 g/dL — ABNORMAL LOW (ref 6.5–8.1)

## 2016-08-14 LAB — GLUCOSE, CAPILLARY
GLUCOSE-CAPILLARY: 129 mg/dL — AB (ref 65–99)
Glucose-Capillary: 137 mg/dL — ABNORMAL HIGH (ref 65–99)
Glucose-Capillary: 129 mg/dL — ABNORMAL HIGH (ref 65–99)
Glucose-Capillary: 129 mg/dL — ABNORMAL HIGH (ref 65–99)
Glucose-Capillary: 125 mg/dL — ABNORMAL HIGH (ref 65–99)
Glucose-Capillary: 125 mg/dL — ABNORMAL HIGH (ref 65–99)

## 2016-08-14 LAB — CBC
HCT: 36.9 % (ref 36.0–46.0)
Hemoglobin: 12.1 g/dL (ref 12.0–15.0)
MCH: 27.4 pg (ref 26.0–34.0)
MCHC: 32.8 g/dL (ref 30.0–36.0)
MCV: 83.7 fL (ref 78.0–100.0)
PLATELETS: 211 10*3/uL (ref 150–400)
RBC: 4.41 MIL/uL (ref 3.87–5.11)
RDW: 13.9 % (ref 11.5–15.5)
WBC: 21.7 10*3/uL — AB (ref 4.0–10.5)

## 2016-08-14 LAB — MAGNESIUM
MAGNESIUM: 2 mg/dL (ref 1.7–2.4)
MAGNESIUM: 2 mg/dL (ref 1.7–2.4)

## 2016-08-14 MED ORDER — POTASSIUM CHLORIDE 20 MEQ/15ML (10%) PO SOLN
40.0000 meq | Freq: Once | ORAL | Status: AC
  Administered 2016-08-14: 40 meq via ORAL
  Filled 2016-08-14: qty 30

## 2016-08-14 MED ORDER — SODIUM PHOSPHATES 45 MMOLE/15ML IV SOLN
10.0000 mmol | Freq: Once | INTRAVENOUS | Status: AC
  Administered 2016-08-14: 10 mmol via INTRAVENOUS
  Filled 2016-08-14: qty 3.33

## 2016-08-14 NOTE — Progress Notes (Signed)
eLink Physician-Brief Progress Note Patient Name: Michelle [name] DOB: 1980-06-01 MRN: [id number]   Date of Service  08/14/2016  HPI/Events of Note  Low phs  eICU Interventions  replace     Intervention Category Intermediate Interventions: Electrolyte abnormality - evaluation and management  Nelda Bucks. 08/14/2016, 9:56 PM
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
## 2016-08-14 NOTE — Care Management Note (Signed)
Case Management Note  Patient Details  Name: Michelle [name] MRN: [id number] Date of Birth: [birth year]  Subjective/Objective:                 Patient admitted after Cardiac Arrest with signs of severe anoxic brain insult and aspiration. ETT, vent on full support. Per note: Doubt her neurological status will improve to the point that she could be weaned for extubated. Palliative meeting daily with family to discuss trajectory and GOC. CM available to assist as needed.    Action/Plan:   Expected Discharge Date:                  Expected Discharge Plan:     In-House Referral:     Discharge planning Services  CM Consult  Post Acute Care Choice:    Choice offered to:     DME Arranged:    DME Agency:     HH Arranged:    HH Agency:     Status of Service:  In process, will continue to follow  If discussed at Long Length of Stay Meetings, dates discussed:    Additional Comments:  Lawerance Sabal, RN 08/14/2016, 2:43 PM
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
## 2016-08-15 ENCOUNTER — Inpatient Hospital Stay (HOSPITAL_COMMUNITY): Payer: Medicare Other

## 2016-08-15 LAB — GLUCOSE, CAPILLARY
GLUCOSE-CAPILLARY: 114 mg/dL — AB (ref 65–99)
GLUCOSE-CAPILLARY: 133 mg/dL — AB (ref 65–99)
Glucose-Capillary: 125 mg/dL — ABNORMAL HIGH (ref 65–99)
Glucose-Capillary: 142 mg/dL — ABNORMAL HIGH (ref 65–99)
Glucose-Capillary: 132 mg/dL — ABNORMAL HIGH (ref 65–99)
Glucose-Capillary: 116 mg/dL — ABNORMAL HIGH (ref 65–99)

## 2016-08-15 LAB — BLOOD GAS, ARTERIAL
ACID-BASE EXCESS: 1.6 mmol/L (ref 0.0–2.0)
Bicarbonate: 25.2 mmol/L (ref 20.0–28.0)
Drawn by: 27407
FIO2: 100
LHR: 20 {breaths}/min
O2 SAT: 99.3 %
PATIENT TEMPERATURE: 98.6
PCO2 ART: 36.6 mmHg (ref 32.0–48.0)
PEEP/CPAP: 5 cmH2O
PH ART: 7.452 — AB (ref 7.350–7.450)
VT: 330 mL
pO2, Arterial: 258 mmHg — ABNORMAL HIGH (ref 83.0–108.0)

## 2016-08-15 LAB — CBC
HEMATOCRIT: 35.2 % — AB (ref 36.0–46.0)
HEMOGLOBIN: 11.3 g/dL — AB (ref 12.0–15.0)
MCH: 27 pg (ref 26.0–34.0)
MCHC: 32.1 g/dL (ref 30.0–36.0)
MCV: 84 fL (ref 78.0–100.0)
Platelets: 208 10*3/uL (ref 150–400)
RBC: 4.19 MIL/uL (ref 3.87–5.11)
RDW: 14.3 % (ref 11.5–15.5)
WBC: 17.6 10*3/uL — AB (ref 4.0–10.5)

## 2016-08-15 LAB — BASIC METABOLIC PANEL
Anion gap: 10 (ref 5–15)
BUN: 17 mg/dL (ref 6–20)
CHLORIDE: 108 mmol/L (ref 101–111)
CO2: 25 mmol/L (ref 22–32)
CREATININE: 0.75 mg/dL (ref 0.44–1.00)
Calcium: 8.8 mg/dL — ABNORMAL LOW (ref 8.9–10.3)
GFR calc Af Amer: >60 mL/min (ref >60)
Glucose, Bld: 119 mg/dL — ABNORMAL HIGH (ref 65–99)
Potassium: 3.1 mmol/L — ABNORMAL LOW (ref 3.5–5.1)
Sodium: 143 mmol/L (ref 135–145)

## 2016-08-15 LAB — MAGNESIUM: Magnesium: 1.9 mg/dL (ref 1.7–2.4)

## 2016-08-15 LAB — PHOSPHORUS: Phosphorus: 2.6 mg/dL (ref 2.5–4.6)

## 2016-08-15 MED ORDER — POTASSIUM CHLORIDE 20 MEQ/15ML (10%) PO SOLN
40.0000 meq | Freq: Once | ORAL | Status: AC
  Administered 2016-08-15: 40 meq via ORAL
  Filled 2016-08-15: qty 30

## 2016-08-15 NOTE — Progress Notes (Signed)
Vent changes made by MD about 30 minutes ago.  RT will continue to monitor.
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
## 2016-08-15 NOTE — Progress Notes (Signed)
Called by primary team to evaluate pt for brain death exam, on arrival Pt was overbreathing the ventilator Neuro exam: Comatose, pupils 4 mm and reactive, positive cough and weak gag, no motor response to stimuli.  Pt has anoxic ischemic encephalopathy with poor neurological exam and poor prognosis, whoever pt still have brain stem function, her neurological exam is not consistent with brain death.   Jonna Munro, MD [facility]
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
## 2016-08-15 NOTE — Progress Notes (Signed)
eLink Physician-Brief Progress Note Patient Name: Michelle [name] DOB: 1980-06-03 MRN: [id number]   Date of Service  08/15/2016  HPI/Events of Note  Patient with profound hypoxia. Anoxic brain injury noted. Current ventilator settings with PEEP 8 & FiO2 1.0 with saturation 100% on telemetry. Peak pressure approximately 32 on camera check.   eICU Intervention 1. Changing to 6 mL/kg ideal body weight with reported height 5 feet 4 inches 2. Continuing current respiratory rate with patient overbreathing ventilator 3. Continuing current FiO2 1.0 4. PEEP increased to 16 cm H2O 5. Weaning parameters for FiO2 & PEEP as well as goal peak and plateau pressures entered into ventilator orders 6. Follow-up ABG ordered for 1610 7. Repeat ABG ordered for a.m. as well      Intervention Category Major Interventions: Respiratory failure - evaluation and management  Lawanda Cousins 08/15/2016, 3:48 PM

## 2016-08-15 NOTE — Progress Notes (Signed)
FIO2 increased to 100% and PEEP increased to 8.  Pt having decreased SATs into the low-mid 80's.  O2 sat now increased to 91%  RT will continue to monitor.

## 2016-08-16 LAB — GLUCOSE, CAPILLARY
GLUCOSE-CAPILLARY: 109 mg/dL — AB (ref 65–99)
GLUCOSE-CAPILLARY: 101 mg/dL — AB (ref 65–99)
GLUCOSE-CAPILLARY: 110 mg/dL — AB (ref 65–99)
Glucose-Capillary: 106 mg/dL — ABNORMAL HIGH (ref 65–99)
Glucose-Capillary: 116 mg/dL — ABNORMAL HIGH (ref 65–99)
Glucose-Capillary: 117 mg/dL — ABNORMAL HIGH (ref 65–99)

## 2016-08-16 LAB — BLOOD GAS, ARTERIAL
Acid-Base Excess: 2.4 mmol/L — ABNORMAL HIGH (ref 0.0–2.0)
BICARBONATE: 25.8 mmol/L (ref 20.0–28.0)
Drawn by: 404151
FIO2: 60
LHR: 20 {breaths}/min
O2 Saturation: 98.8 %
PEEP: 16 cmH2O
Patient temperature: 99.4
VT: 330 mL
pCO2 arterial: 35.9 mmHg (ref 32.0–48.0)
pH, Arterial: 7.472 — ABNORMAL HIGH (ref 7.350–7.450)
pO2, Arterial: 136 mmHg — ABNORMAL HIGH (ref 83.0–108.0)

## 2016-08-16 LAB — BASIC METABOLIC PANEL
ANION GAP: 10 (ref 5–15)
BUN: 18 mg/dL (ref 6–20)
CALCIUM: 8.7 mg/dL — AB (ref 8.9–10.3)
CO2: 25 mmol/L (ref 22–32)
CREATININE: 0.61 mg/dL (ref 0.44–1.00)
Chloride: 109 mmol/L (ref 101–111)
GLUCOSE: 122 mg/dL — AB (ref 65–99)
Potassium: 3.4 mmol/L — ABNORMAL LOW (ref 3.5–5.1)
Sodium: 144 mmol/L (ref 135–145)

## 2016-08-16 LAB — CBC
HCT: 36.2 % (ref 36.0–46.0)
Hemoglobin: 11.7 g/dL — ABNORMAL LOW (ref 12.0–15.0)
MCH: 27.3 pg (ref 26.0–34.0)
MCHC: 32.3 g/dL (ref 30.0–36.0)
MCV: 84.4 fL (ref 78.0–100.0)
PLATELETS: 258 10*3/uL (ref 150–400)
RBC: 4.29 MIL/uL (ref 3.87–5.11)
RDW: 14.3 % (ref 11.5–15.5)
WBC: 17.3 10*3/uL — ABNORMAL HIGH (ref 4.0–10.5)

## 2016-08-16 LAB — CULTURE, BLOOD (ROUTINE X 2)
Culture: NO GROWTH
Culture: NO GROWTH
SPECIAL REQUESTS: ADEQUATE
SPECIAL REQUESTS: ADEQUATE

## 2016-08-16 MED ORDER — FENTANYL CITRATE (PF) 100 MCG/2ML IJ SOLN
100.0000 ug | INTRAMUSCULAR | Status: AC | PRN
  Administered 2016-08-16 – 2016-08-17 (×3): 100 ug via INTRAVENOUS
  Filled 2016-08-16 (×5): qty 2

## 2016-08-16 MED ORDER — FENTANYL CITRATE (PF) 100 MCG/2ML IJ SOLN
100.0000 ug | INTRAMUSCULAR | Status: DC | PRN
  Administered 2016-08-16 – 2016-08-24 (×17): 100 ug via INTRAVENOUS
  Filled 2016-08-16 (×15): qty 2

## 2016-08-16 MED ORDER — HYPROMELLOSE (GONIOSCOPIC) 2.5 % OP SOLN
1.0000 [drp] | Freq: Three times a day (TID) | OPHTHALMIC | Status: DC
  Administered 2016-08-16 – 2016-08-26 (×29): 1 [drp] via OPHTHALMIC
  Filled 2016-08-16: qty 15
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
## 2016-08-16 NOTE — Progress Notes (Signed)
Subjective: Interval History: No major changes.  No sedation.  Breathing over the ventilator.  Objective: Vital signs in last 24 hours: Temp:  [98.6 F (37 C)-100.4 F (38 C)] 100 F (37.8 C) (04/13 0908) Pulse Rate:  [60-109] 101 (04/13 0908) Resp:  [15-36] 36 (04/13 0908) BP: (94-160)/(50-141) 133/89 (04/13 0908) SpO2:  [88 %-100 %] 100 % (04/13 0908) FiO2 (%):  [40 %-100 %] 40 % (04/13 0908) Weight:  [105 kg (231 lb 7.7 oz)] 105 kg (231 lb 7.7 oz) (04/13 0500)  Intake/Output from previous day: 04/12 0701 - 04/13 0700 In: 1770 [I.V.:230; NG/GT:1120; IV Piggyback:400] Out: 1065 [Urine:1065] Intake/Output this shift: Total I/O In: 30 [I.V.:30] Out: 225 [Urine:225] Nutritional status: Diet NPO time specified  Neurologic Exam:  Intubated.   Pupils not reactive. No corneal reflexes. No oculocephalic reflex. No cough reflex. Cold calorics negative. Extensor posturing to pain in upper extremities. No withdrawal to pain in lower extremities.    Lab Results:  Recent Labs  08/15/16 0351 08/16/16 0838  WBC 17.6* 17.3*  HGB 11.3* 11.7*  HCT 35.2* 36.2  PLT 208 258  NA 143 144  K 3.1* 3.4*  CL 108 109  CO2 25 25  GLUCOSE 119* 122*  BUN 17 18  CREATININE 0.75 0.61  CALCIUM 8.8* 8.7*   Lipid Panel No results for input(s): CHOL, TRIG, HDL, CHOLHDL, VLDL, LDLCALC in the last 72 hours.  Studies/Results: Dg Chest Port 1 View  Result Date: 08/15/2016 CLINICAL DATA:  Endotracheal tube EXAM: PORTABLE CHEST 1 VIEW COMPARISON:  Two days ago FINDINGS: Endotracheal tube tip at the clavicular heads. An orogastric tube at least reaches the stomach. Progressed hazy density at the right base with obscured diaphragm. Possible mild patchy density on the left, but limited by overlapping hardware. No generalized Kerley lines, effusion, or pneumothorax. IMPRESSION: 1. Stable positioning of endotracheal and orogastric tubes. 2. Progressed opacity at the right base, likely  atelectasis/lobar collapse given fluctuating opacity on previous studies. Cannot exclude superimposed pneumonia. Electronically Signed   By: Monte Fantasia M.D.   On: 08/15/2016 08:00    Medications:  Scheduled: . ampicillin-sulbactam (UNASYN) IV  3 g Intravenous Q6H  . chlorhexidine gluconate (MEDLINE KIT)  15 mL Mouth Rinse BID  . feeding supplement (PRO-STAT SUGAR FREE 64)  30 mL Per Tube BID  . feeding supplement (VITAL HIGH PROTEIN)  1,000 mL Per Tube Q24H  . heparin  5,000 Units Subcutaneous Q8H  . hydroxypropyl methylcellulose / hypromellose  1 drop Both Eyes TID  . mouth rinse  15 mL Mouth Rinse 10 times per day  . multivitamin  15 mL Per Tube Daily  . pantoprazole (PROTONIX) IV  40 mg Intravenous Q24H    Assessment/Plan:  Cardiac arrest with anoxic brain injury.  She is not completely brain dead, but she has lost a significant portion of her brainstem function except for lower medullary respiratory control.    Statistically at 7 days after arrest as in this case, if there is no eye opening to pain at least there is 0% probability of moderate to good recovery.    I recommended withdrawal of care and comfort measures.  Spoke to family and they will likely pursue that route in the near future.    Will sign off.     LOS: 6 days   Rogue Jury, MD 08/16/2016  10:23 AM

## 2016-08-17 LAB — CBC
HCT: 32.8 % — ABNORMAL LOW (ref 36.0–46.0)
Hemoglobin: 10.4 g/dL — ABNORMAL LOW (ref 12.0–15.0)
MCH: 27.2 pg (ref 26.0–34.0)
MCHC: 31.7 g/dL (ref 30.0–36.0)
MCV: 85.6 fL (ref 78.0–100.0)
PLATELETS: 278 10*3/uL (ref 150–400)
RBC: 3.83 MIL/uL — ABNORMAL LOW (ref 3.87–5.11)
RDW: 14.5 % (ref 11.5–15.5)
WBC: 13.7 10*3/uL — ABNORMAL HIGH (ref 4.0–10.5)

## 2016-08-17 LAB — GLUCOSE, CAPILLARY
GLUCOSE-CAPILLARY: 111 mg/dL — AB (ref 65–99)
Glucose-Capillary: 107 mg/dL — ABNORMAL HIGH (ref 65–99)
Glucose-Capillary: 114 mg/dL — ABNORMAL HIGH (ref 65–99)
Glucose-Capillary: 117 mg/dL — ABNORMAL HIGH (ref 65–99)
Glucose-Capillary: 123 mg/dL — ABNORMAL HIGH (ref 65–99)
Glucose-Capillary: 137 mg/dL — ABNORMAL HIGH (ref 65–99)

## 2016-08-17 LAB — PROCALCITONIN: PROCALCITONIN: 0.68 ng/mL

## 2016-08-17 LAB — BASIC METABOLIC PANEL
Anion gap: 10 (ref 5–15)
BUN: 21 mg/dL — AB (ref 6–20)
CHLORIDE: 108 mmol/L (ref 101–111)
CO2: 26 mmol/L (ref 22–32)
CREATININE: 0.71 mg/dL (ref 0.44–1.00)
Calcium: 8.8 mg/dL — ABNORMAL LOW (ref 8.9–10.3)
GFR calc Af Amer: >60 mL/min (ref >60)
GFR calc non Af Amer: >60 mL/min (ref >60)
Glucose, Bld: 130 mg/dL — ABNORMAL HIGH (ref 65–99)
Potassium: 3 mmol/L — ABNORMAL LOW (ref 3.5–5.1)
Sodium: 144 mmol/L (ref 135–145)

## 2016-08-17 LAB — TRIGLYCERIDES: Triglycerides: 125 mg/dL (ref <150)

## 2016-08-17 MED ORDER — FUROSEMIDE 10 MG/ML IJ SOLN
40.0000 mg | Freq: Once | INTRAMUSCULAR | Status: AC
  Administered 2016-08-17: 40 mg via INTRAVENOUS
  Filled 2016-08-17: qty 4

## 2016-08-17 MED ORDER — MIDAZOLAM HCL 2 MG/2ML IJ SOLN
2.0000 mg | INTRAMUSCULAR | Status: DC | PRN
  Administered 2016-08-18 – 2016-08-24 (×9): 2 mg via INTRAVENOUS
  Filled 2016-08-17 (×7): qty 2

## 2016-08-17 MED ORDER — MIDAZOLAM HCL 2 MG/2ML IJ SOLN
2.0000 mg | INTRAMUSCULAR | Status: DC | PRN
  Administered 2016-08-17 (×2): 2 mg via INTRAVENOUS
  Filled 2016-08-17 (×4): qty 2

## 2016-08-17 MED ORDER — PROPOFOL 1000 MG/100ML IV EMUL
0.0000 ug/kg/min | INTRAVENOUS | Status: DC
  Administered 2016-08-17: 5 ug/kg/min via INTRAVENOUS
  Administered 2016-08-18 (×4): 30 ug/kg/min via INTRAVENOUS
  Filled 2016-08-17 (×6): qty 100

## 2016-08-17 MED ORDER — POTASSIUM CHLORIDE 20 MEQ/15ML (10%) PO SOLN
40.0000 meq | Freq: Three times a day (TID) | ORAL | Status: AC
  Administered 2016-08-17 (×3): 40 meq via ORAL
  Filled 2016-08-17 (×2): qty 30

## 2016-08-17 NOTE — Plan of Care (Signed)
Problem: Nutritional: Goal: Intake of prescribed amount of daily calories will improve Outcome: Progressing Pt on tube feedings
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
## 2016-08-17 NOTE — Progress Notes (Signed)
eLink Physician-Brief Progress Note Patient Name: Michelle [name] DOB: 02-07-1981 MRN: [id number]   Date of Service  08/17/2016  HPI/Events of Note  k 3.0   eICU Interventions  replaced     Intervention Category Minor Interventions: Electrolytes abnormality - evaluation and management  Henry Russel, P 08/17/2016, 5:54 AM
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
## 2016-08-17 NOTE — Plan of Care (Signed)
Problem: Education: Goal: Knowledge of [facility] Education information/materials will improve Outcome: Not Progressing Pt unresponsive, unable to participate in education.  Problem: Health Behavior/Discharge Planning: Goal: Ability to manage health-related needs will improve Outcome: Not Progressing Pt unresonsive; unable to participate in care.  Problem: Activity: Goal: Risk for activity intolerance will decrease Outcome: Progressing Q2 hour turns, PROM exercises.  Problem: Coping: Goal: Level of anxiety will decrease Unable to assess d/t pt unresponsive.  Problem: Role Relationship: Goal: Method of communication will improve Outcome: Not Progressing Pt unresponsive, not communicative.

## 2016-08-17 NOTE — Progress Notes (Signed)
Patient O2 Sats dropped to 88%. RT performed a recruitment maneuver and patient O2 sats came up.  RT called Elink MD to talk about the vent order settings and changing the FiO2 versus the PEEP.  Dr. Katrinka Blazing stated that it was ok to increase the FiO2 at this time.  RT changed the O2 to 60% and Peep is 10.

## 2016-08-18 ENCOUNTER — Inpatient Hospital Stay (HOSPITAL_COMMUNITY): Payer: Medicare Other

## 2016-08-18 LAB — CBC
HEMATOCRIT: 34.8 % — AB (ref 36.0–46.0)
Hemoglobin: 11 g/dL — ABNORMAL LOW (ref 12.0–15.0)
MCH: 27.3 pg (ref 26.0–34.0)
MCHC: 31.6 g/dL (ref 30.0–36.0)
MCV: 86.4 fL (ref 78.0–100.0)
Platelets: 322 10*3/uL (ref 150–400)
RBC: 4.03 MIL/uL (ref 3.87–5.11)
RDW: 15.1 % (ref 11.5–15.5)
WBC: 11.9 10*3/uL — ABNORMAL HIGH (ref 4.0–10.5)

## 2016-08-18 LAB — BASIC METABOLIC PANEL
Anion gap: 8 (ref 5–15)
BUN: 23 mg/dL — AB (ref 6–20)
CO2: 26 mmol/L (ref 22–32)
Calcium: 9.1 mg/dL (ref 8.9–10.3)
Chloride: 111 mmol/L (ref 101–111)
Creatinine, Ser: 0.75 mg/dL (ref 0.44–1.00)
GFR calc Af Amer: >60 mL/min (ref >60)
GLUCOSE: 120 mg/dL — AB (ref 65–99)
POTASSIUM: 4 mmol/L (ref 3.5–5.1)
Sodium: 145 mmol/L (ref 135–145)

## 2016-08-18 LAB — GLUCOSE, CAPILLARY
GLUCOSE-CAPILLARY: 122 mg/dL — AB (ref 65–99)
GLUCOSE-CAPILLARY: 111 mg/dL — AB (ref 65–99)
Glucose-Capillary: 102 mg/dL — ABNORMAL HIGH (ref 65–99)
Glucose-Capillary: 98 mg/dL (ref 65–99)
Glucose-Capillary: 107 mg/dL — ABNORMAL HIGH (ref 65–99)

## 2016-08-18 LAB — PROCALCITONIN: PROCALCITONIN: 0.43 ng/mL

## 2016-08-18 LAB — PHOSPHORUS: Phosphorus: 3.7 mg/dL (ref 2.5–4.6)

## 2016-08-18 LAB — MAGNESIUM: Magnesium: 2.2 mg/dL (ref 1.7–2.4)

## 2016-08-18 MED ORDER — PROPOFOL 1000 MG/100ML IV EMUL
0.0000 ug/kg/min | INTRAVENOUS | Status: DC
  Administered 2016-08-18 – 2016-08-19 (×6): 30 ug/kg/min via INTRAVENOUS
  Administered 2016-08-20: 15 ug/kg/min via INTRAVENOUS
  Administered 2016-08-20: 25 ug/kg/min via INTRAVENOUS
  Administered 2016-08-21: 10 ug/kg/min via INTRAVENOUS
  Administered 2016-08-21 (×2): 25 ug/kg/min via INTRAVENOUS
  Administered 2016-08-21: 15 ug/kg/min via INTRAVENOUS
  Administered 2016-08-22 (×2): 25 ug/kg/min via INTRAVENOUS
  Administered 2016-08-23 (×3): 30 ug/kg/min via INTRAVENOUS
  Administered 2016-08-23: 25 ug/kg/min via INTRAVENOUS
  Administered 2016-08-24: 30 ug/kg/min via INTRAVENOUS
  Filled 2016-08-18 (×21): qty 100
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
## 2016-08-19 LAB — CBC
HEMATOCRIT: 36.3 % (ref 36.0–46.0)
HEMOGLOBIN: 11.3 g/dL — AB (ref 12.0–15.0)
MCH: 26.8 pg (ref 26.0–34.0)
MCHC: 31.1 g/dL (ref 30.0–36.0)
MCV: 86.2 fL (ref 78.0–100.0)
Platelets: 360 10*3/uL (ref 150–400)
RBC: 4.21 MIL/uL (ref 3.87–5.11)
RDW: 14.9 % (ref 11.5–15.5)
WBC: 13.9 10*3/uL — AB (ref 4.0–10.5)

## 2016-08-19 LAB — BLOOD GAS, ARTERIAL
Acid-Base Excess: 1.6 mmol/L (ref 0.0–2.0)
Bicarbonate: 24.5 mmol/L (ref 20.0–28.0)
DRAWN BY: 441371
FIO2: 40
O2 SAT: 95 %
PEEP: 8 cmH2O
PH ART: 7.503 — AB (ref 7.350–7.450)
Patient temperature: 100
RATE: 20 resp/min
VT: 440 mL
pCO2 arterial: 31.6 mmHg — ABNORMAL LOW (ref 32.0–48.0)
pO2, Arterial: 72.4 mmHg — ABNORMAL LOW (ref 83.0–108.0)

## 2016-08-19 LAB — BASIC METABOLIC PANEL
ANION GAP: 9 (ref 5–15)
BUN: 24 mg/dL — AB (ref 6–20)
CHLORIDE: 109 mmol/L (ref 101–111)
CO2: 25 mmol/L (ref 22–32)
Calcium: 9.2 mg/dL (ref 8.9–10.3)
Creatinine, Ser: 0.73 mg/dL (ref 0.44–1.00)
GFR calc Af Amer: >60 mL/min (ref >60)
GFR calc non Af Amer: >60 mL/min (ref >60)
GLUCOSE: 104 mg/dL — AB (ref 65–99)
POTASSIUM: 4.5 mmol/L (ref 3.5–5.1)
SODIUM: 143 mmol/L (ref 135–145)

## 2016-08-19 LAB — GLUCOSE, CAPILLARY
GLUCOSE-CAPILLARY: 109 mg/dL — AB (ref 65–99)
GLUCOSE-CAPILLARY: 111 mg/dL — AB (ref 65–99)
GLUCOSE-CAPILLARY: 112 mg/dL — AB (ref 65–99)
GLUCOSE-CAPILLARY: 115 mg/dL — AB (ref 65–99)
GLUCOSE-CAPILLARY: 86 mg/dL (ref 65–99)
Glucose-Capillary: 132 mg/dL — ABNORMAL HIGH (ref 65–99)
Glucose-Capillary: 121 mg/dL — ABNORMAL HIGH (ref 65–99)

## 2016-08-19 LAB — PROCALCITONIN: Procalcitonin: 0.31 ng/mL

## 2016-08-19 MED ORDER — PANTOPRAZOLE SODIUM 40 MG PO PACK
40.0000 mg | PACK | ORAL | Status: DC
  Administered 2016-08-19 – 2016-08-23 (×5): 40 mg
  Filled 2016-08-19 (×5): qty 20
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
## 2016-08-20 LAB — BASIC METABOLIC PANEL
Anion gap: 8 (ref 5–15)
BUN: 19 mg/dL (ref 6–20)
CALCIUM: 8.2 mg/dL — AB (ref 8.9–10.3)
CO2: 21 mmol/L — AB (ref 22–32)
CREATININE: 0.62 mg/dL (ref 0.44–1.00)
Chloride: 109 mmol/L (ref 101–111)
GFR calc non Af Amer: >60 mL/min (ref >60)
Glucose, Bld: 169 mg/dL — ABNORMAL HIGH (ref 65–99)
Potassium: 4.8 mmol/L (ref 3.5–5.1)
Sodium: 138 mmol/L (ref 135–145)

## 2016-08-20 LAB — GLUCOSE, CAPILLARY
Glucose-Capillary: 125 mg/dL — ABNORMAL HIGH (ref 65–99)
Glucose-Capillary: 119 mg/dL — ABNORMAL HIGH (ref 65–99)
Glucose-Capillary: 135 mg/dL — ABNORMAL HIGH (ref 65–99)
Glucose-Capillary: 118 mg/dL — ABNORMAL HIGH (ref 65–99)
Glucose-Capillary: 146 mg/dL — ABNORMAL HIGH (ref 65–99)

## 2016-08-20 LAB — CBC
HCT: 36.9 % (ref 36.0–46.0)
Hemoglobin: 11.9 g/dL — ABNORMAL LOW (ref 12.0–15.0)
MCH: 27.4 pg (ref 26.0–34.0)
MCHC: 32.2 g/dL (ref 30.0–36.0)
MCV: 85 fL (ref 78.0–100.0)
PLATELETS: 445 10*3/uL — AB (ref 150–400)
RBC: 4.34 MIL/uL (ref 3.87–5.11)
RDW: 14.6 % (ref 11.5–15.5)
WBC: 19.7 10*3/uL — ABNORMAL HIGH (ref 4.0–10.5)

## 2016-08-20 LAB — TRIGLYCERIDES: TRIGLYCERIDES: 152 mg/dL — AB (ref <150)

## 2016-08-20 MED ORDER — INSULIN ASPART 100 UNIT/ML ~~LOC~~ SOLN
2.0000 [IU] | SUBCUTANEOUS | Status: DC
  Administered 2016-08-20 – 2016-08-22 (×8): 2 [IU] via SUBCUTANEOUS
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
## 2016-08-20 NOTE — Progress Notes (Signed)
RT note- patient has a bite block in, ETT unable to move at this time.

## 2016-08-20 NOTE — Care Management Note (Signed)
Case Management Note Original Note created by Lawerance Sabal  Patient Details  Name: Michelle [name] MRN: [id number] Date of Birth: [birth year]  Subjective/Objective:                 Patient admitted after Cardiac Arrest with signs of severe anoxic brain insult and aspiration. ETT, vent on full support. Per note: Doubt her neurological status will improve to the point that she could be weaned for extubated. Palliative meeting daily with family to discuss trajectory and GOC. CM available to assist as needed.    Action/Plan:   Expected Discharge Date:                  Expected Discharge Plan:     In-House Referral:     Discharge planning Services  CM Consult  Post Acute Care Choice:    Choice offered to:     DME Arranged:    DME Agency:     HH Arranged:    HH Agency:     Status of Service:  In process, will continue to follow  If discussed at Long Length of Stay Meetings, dates discussed:    Additional Comments: 08/20/2016  Pt remains on ventilator.  Comatose, with severe anoxic injury.  GOC meetings  ongoing  Michelle [name] 08/20/2016, 1:52 PM
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
## 2016-08-20 NOTE — Progress Notes (Signed)
Nutrition Follow-up  DOCUMENTATION CODES:   Obesity unspecified  INTERVENTION:   Vital High Protein @ 40 ml/h (960 ml per day) and Prostat 30 ml BID to provide 1160 kcals, 114 gm protein, 806 ml free water daily.  TF regimen and propofol at current rate providing 1315 total kcal/day   Provide liquid MVI per tube daily.    NUTRITION DIAGNOSIS:   Inadequate oral intake related to inability to eat as evidenced by estimated needs. Ongoing.   GOAL:   Provide needs based on ASPEN/SCCM guidelines Met.   MONITOR:   Vent status, Labs, Weight trends, TF tolerance, Skin, I & O's  ASSESSMENT:   36 y/o woman with cardiac arrest. Etiology unclear but suspect toxic ingestion, although UDS negative. Likely has suffered significant anoxic injury. Course complicated by hypoxemia, fevers, possible aspiration. Repeat imaging concerning for anoxic injury.   Pt remains on vent, now on small dose of propofol. 5.9 ml/hr (155 kcal/day) Plan for withdrawal   Diet Order:  Diet NPO time specified  Skin:  Reviewed, no issues  Last BM:  4/14  Height:   Ht Readings from Last 1 Encounters:  08/19/16 _0  (1.626 m)    Weight:   Wt Readings from Last 1 Encounters:  08/20/16 221 lb 5.5 oz (100.4 kg)    Ideal Body Weight:  54.5 kg  BMI:  Body mass index is 37.99 kg/m.  Estimated Nutritional Needs:   Kcal:  706-2376  Protein:  >/= 109 grams  Fluid:  Per MD  EDUCATION NEEDS:   No education needs identified at this time  [place], Rosalie, [place] Pager [phone] After Hours Pager

## 2016-08-21 LAB — GLUCOSE, CAPILLARY
GLUCOSE-CAPILLARY: 129 mg/dL — AB (ref 65–99)
GLUCOSE-CAPILLARY: 137 mg/dL — AB (ref 65–99)
Glucose-Capillary: 117 mg/dL — ABNORMAL HIGH (ref 65–99)
Glucose-Capillary: 123 mg/dL — ABNORMAL HIGH (ref 65–99)
Glucose-Capillary: 124 mg/dL — ABNORMAL HIGH (ref 65–99)
Glucose-Capillary: 125 mg/dL — ABNORMAL HIGH (ref 65–99)
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
## 2016-08-22 DIAGNOSIS — Z515 Encounter for palliative care: Secondary | ICD-10-CM

## 2016-08-22 LAB — GLUCOSE, CAPILLARY
Glucose-Capillary: 141 mg/dL — ABNORMAL HIGH (ref 65–99)
Glucose-Capillary: 126 mg/dL — ABNORMAL HIGH (ref 65–99)
Glucose-Capillary: 124 mg/dL — ABNORMAL HIGH (ref 65–99)

## 2016-08-22 NOTE — Progress Notes (Signed)
Michelle [name] is a 36 y.o. female admitted on 08/25/2016 after being unresponsive at home with concern for drug overdose.  She had respiratory arrest leading to cardiac arrest with 10 minutes for ROSC.  She was noted to have myoclonic activity.  EEG showed burst suppression.  She developed infiltrate on chest xray and fever.  She was started on antibiotics for aspiration pneumonia.  Neurology was consulted to assist with neurologic prognosis.  Follow up CT head showed b/l cerebral edema.  Palliative care was consulted.  She was made DNR and plan to transition to comfort care.  BP (!) 140/94   Pulse (!) 116   Temp 99.3 F (37.4 C)   Resp (!) 26   Ht  (1.626 m)   Wt 217 lb 13 oz (98.8 kg)   LMP  (LMP Unknown)   SpO2 98%   BMI 37.39 kg/m   I/O last 3 completed shifts: In: 2135.3 [I.V.:745.3; NG/GT:1390] Out: 2465 [Urine:2190; Emesis/NG output:275]  General - unresponsive Neuro - not following commands Eyes - pupils dilated, non reactive Cardiac - regular, tachycardic Chest - b/l rhonchi Abd - soft, non tender Ext - 1+ edema Skin - no rashes  CMP Latest Ref Rng & Units 08/20/2016 08/19/2016 08/18/2016  Glucose 65 - 99 mg/dL 161(W) 960(A) 540(J)  BUN 6 - 20 mg/dL 19 81(X) 91(Y)  Creatinine 0.44 - 1.00 mg/dL 7.82 9.56 2.13  Sodium 135 - 145 mmol/L 138 143 145  Potassium 3.5 - 5.1 mmol/L 4.8 4.5 4.0  Chloride 101 - 111 mmol/L 109 109 111  CO2 22 - 32 mmol/L 21(L) 25 26  Calcium 8.9 - 10.3 mg/dL 8.2(L) 9.2 9.1  Total Protein 6.5 - 8.1 g/dL - - -  Total Bilirubin 0.3 - 1.2 mg/dL - - -  Alkaline Phos 38 - 126 U/L - - -  AST 15 - 41 U/L - - -  ALT 14 - 54 U/L - - -    CBC Latest Ref Rng & Units 08/20/2016 08/19/2016 08/18/2016  WBC 4.0 - 10.5 K/uL 19.7(H) 13.9(H) 11.9(H)  Hemoglobin 12.0 - 15.0 g/dL 11.9(L) 11.3(L) 11.0(L)  Hematocrit 36.0 - 46.0 % 36.9 36.3 34.8(L)  Platelets 150 - 400 K/uL 445(H) 360 322    Drugs of Abuse     Component Value Date/Time   LABOPIA NONE  DETECTED 08/10/2016 0123   COCAINSCRNUR NONE DETECTED 08/10/2016 0123   LABBENZ NONE DETECTED 08/10/2016 0123   AMPHETMU NONE DETECTED 08/10/2016 0123   THCU NONE DETECTED 08/10/2016 0123   LABBARB NONE DETECTED 08/10/2016 0123      Assessment: Respiratory arrest leading to cardiac arrest Cardiogenic shock Acute renal failure Anoxic encephalopathy Cerebral edema Myoclonus Accidental drug overdose Aspiration pneumonia  Plan: DNR Plan to transition to comfort care 4/21  Coralyn Helling, MD [facility] Pulmonary/Critical Care 08/22/2016, 9:05 AM Pager:  [phone] After 3pm call: [phone]
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
## 2016-08-23 MED ORDER — ATROPINE SULFATE 1 % OP SOLN
2.0000 [drp] | Freq: Four times a day (QID) | OPHTHALMIC | Status: DC | PRN
  Administered 2016-08-23 – 2016-08-24 (×3): 2 [drp] via SUBLINGUAL
  Filled 2016-08-23: qty 2

## 2016-08-23 NOTE — Progress Notes (Signed)
Michelle [name] is a 36 y.o. female admitted on 08/28/2016 after being unresponsive at home with concern for drug overdose.  She had respiratory arrest leading to cardiac arrest with 10 minutes for ROSC.  She was noted to have myoclonic activity.  EEG showed burst suppression.  She developed infiltrate on chest xray and fever.  She was started on antibiotics for aspiration pneumonia.  Neurology was consulted to assist with neurologic prognosis.  Follow up CT head showed b/l cerebral edema.  Palliative care was consulted.  She was made DNR and plan to transition to comfort care.  Subjective: Breathing over vent at times.  BP (!) 123/102   Pulse (!) 105   Temp 99.5 F (37.5 C) (Core (Comment))   Resp 20   Ht  (1.626 m)   Wt 215 lb 13.3 oz (97.9 kg)   LMP  (LMP Unknown)   SpO2 99%   BMI 37.05 kg/m   I/O last 3 completed shifts: In: 2714.6 [I.V.:874.6; NG/GT:1840] Out: 2100 [Urine:1825; Emesis/NG output:275]  General - unresponsive Neuro - not following commands Eyes - pupils dilated Cardiac - regular Chest - b/l rhonchi Abd - soft, non tender Ext - 1+ edema Skin - no rashes  CMP Latest Ref Rng & Units 08/20/2016 08/19/2016 08/18/2016  Glucose 65 - 99 mg/dL 161(W) 960(A) 540(J)  BUN 6 - 20 mg/dL 19 81(X) 91(Y)  Creatinine 0.44 - 1.00 mg/dL 7.82 9.56 2.13  Sodium 135 - 145 mmol/L 138 143 145  Potassium 3.5 - 5.1 mmol/L 4.8 4.5 4.0  Chloride 101 - 111 mmol/L 109 109 111  CO2 22 - 32 mmol/L 21(L) 25 26  Calcium 8.9 - 10.3 mg/dL 8.2(L) 9.2 9.1  Total Protein 6.5 - 8.1 g/dL - - -  Total Bilirubin 0.3 - 1.2 mg/dL - - -  Alkaline Phos 38 - 126 U/L - - -  AST 15 - 41 U/L - - -  ALT 14 - 54 U/L - - -    CBC Latest Ref Rng & Units 08/20/2016 08/19/2016 08/18/2016  WBC 4.0 - 10.5 K/uL 19.7(H) 13.9(H) 11.9(H)  Hemoglobin 12.0 - 15.0 g/dL 11.9(L) 11.3(L) 11.0(L)  Hematocrit 36.0 - 46.0 % 36.9 36.3 34.8(L)  Platelets 150 - 400 K/uL 445(H) 360 322    Drugs of Abuse     Component  Value Date/Time   LABOPIA NONE DETECTED 08/10/2016 0123   COCAINSCRNUR NONE DETECTED 08/10/2016 0123   LABBENZ NONE DETECTED 08/10/2016 0123   AMPHETMU NONE DETECTED 08/10/2016 0123   THCU NONE DETECTED 08/10/2016 0123   LABBARB NONE DETECTED 08/10/2016 0123      Assessment: Respiratory arrest leading to cardiac arrest Cardiogenic shock Acute renal failure Anoxic encephalopathy Cerebral edema Myoclonus Accidental drug overdose Aspiration pneumonia  Plan: DNR Plan for terminal extubation 4/21 I would anticipate progression to cardiac arrest within an hour after extubation  Coralyn Helling, MD [facility] Pulmonary/Critical Care 08/23/2016, 8:59 AM Pager:  [phone] After 3pm call: [phone]

## 2016-08-23 NOTE — Progress Notes (Signed)
eLink Physician-Brief Progress Note Patient Name: Michelle [name] DOB: Jun 18, 1980 MRN: [id number]   Date of Service  08/23/2016  HPI/Events of Note  Excessive oral secretions - request for medication.   eICU Interventions  Will order: 1. Atropine solution 1% 2 drops SL Q 6 hours PRN excessive oral secretions.      Intervention Category Major Interventions: Other:  Sommer,Steven Dennard Nip 08/23/2016, 1:10 AM

## 2016-08-23 NOTE — Care Management Note (Signed)
Case Management Note Original Note created by Lawerance Sabal  Patient Details  Name: Michelle [name] MRN: [id number] Date of Birth: 09/24/1980  Subjective/Objective:                 Patient admitted after Cardiac Arrest with signs of severe anoxic brain insult and aspiration. ETT, vent on full support. Per note: Doubt her neurological status will improve to the point that she could be weaned for extubated. Palliative meeting daily with family to discuss trajectory and GOC. CM available to assist as needed.    Action/Plan:   Expected Discharge Date:                  Expected Discharge Plan:     In-House Referral:     Discharge planning Services  CM Consult  Post Acute Care Choice:    Choice offered to:     DME Arranged:    DME Agency:     HH Arranged:    HH Agency:     Status of Service:  In process, will continue to follow  If discussed at Long Length of Stay Meetings, dates discussed:    Additional Comments: 08/23/2016  Pt will have terminal extubation 08/24/16  08/20/16 Pt remains on ventilator.  Comatose, with severe anoxic injury.  GOC meetings  ongoing  Cherylann Parr, RN 08/23/2016, 3:26 PM

## 2016-08-23 NOTE — Progress Notes (Signed)
No charge note.  No family at bedside.  One way extubation scheduled for 08/24/16. Expect that patient will not survive for long after extubation.  PMT will continue to follow for further needs.  Ocie Bob, AGNP-C Palliative Medicine  Please call Palliative Medicine team phone with any questions [phone]. For individual providers please see AMION.

## 2016-08-23 NOTE — Progress Notes (Signed)
No SBT this AM due to planned one way extubation on Saturday.  Patient resting comfortably at this time.  No distress noted.  Will continue to monitor.

## 2016-08-24 MED ORDER — BIOTENE DRY MOUTH MT LIQD: 15.0000 mL | OROMUCOSAL | Status: DC | PRN

## 2016-08-24 MED ORDER — ACETAMINOPHEN 650 MG RE SUPP: 650.0000 mg | Freq: Four times a day (QID) | RECTAL | Status: DC | PRN

## 2016-08-24 MED ORDER — SODIUM CHLORIDE 0.9 % IV SOLN
10.0000 mg/h | INTRAVENOUS | Status: DC
  Administered 2016-08-24: 10 mg/h via INTRAVENOUS
  Filled 2016-08-24 (×2): qty 10

## 2016-08-24 MED ORDER — GLYCOPYRROLATE 0.2 MG/ML IJ SOLN: 0.2000 mg | INTRAMUSCULAR | Status: DC | PRN

## 2016-08-24 MED ORDER — POLYVINYL ALCOHOL 1.4 % OP SOLN
1.0000 [drp] | Freq: Four times a day (QID) | OPHTHALMIC | Status: DC | PRN
  Filled 2016-08-24: qty 15

## 2016-08-24 MED ORDER — MORPHINE BOLUS VIA INFUSION
4.0000 mg | INTRAVENOUS | Status: DC | PRN
  Administered 2016-08-24 (×2): 4 mg via INTRAVENOUS
  Filled 2016-08-24: qty 4

## 2016-08-24 MED ORDER — ONDANSETRON HCL 4 MG/2ML IJ SOLN: 4.0000 mg | Freq: Four times a day (QID) | INTRAMUSCULAR | Status: DC | PRN

## 2016-08-24 MED ORDER — ONDANSETRON 4 MG PO TBDP: 4.0000 mg | ORAL_TABLET | Freq: Four times a day (QID) | ORAL | Status: DC | PRN

## 2016-08-24 MED ORDER — SODIUM CHLORIDE 0.9 % IV SOLN
INTRAVENOUS | Status: DC
  Administered 2016-08-24: 21:00:00 via INTRAVENOUS

## 2016-08-24 MED ORDER — GLYCOPYRROLATE 0.2 MG/ML IJ SOLN: 0.4000 mg | INTRAMUSCULAR | Status: DC

## 2016-08-24 MED ORDER — IPRATROPIUM-ALBUTEROL 0.5-2.5 (3) MG/3ML IN SOLN
3.0000 mL | Freq: Four times a day (QID) | RESPIRATORY_TRACT | Status: DC
  Filled 2016-08-24: qty 3

## 2016-08-24 MED ORDER — HALOPERIDOL LACTATE 5 MG/ML IJ SOLN: 0.5000 mg | INTRAMUSCULAR | Status: DC | PRN

## 2016-08-24 MED ORDER — ACETAMINOPHEN 325 MG PO TABS: 650.0000 mg | ORAL_TABLET | Freq: Four times a day (QID) | ORAL | Status: DC | PRN

## 2016-08-24 MED ORDER — GLYCOPYRROLATE 0.2 MG/ML IJ SOLN
0.2000 mg | INTRAMUSCULAR | Status: DC | PRN
  Administered 2016-08-24 – 2016-08-26 (×4): 0.2 mg via INTRAVENOUS
  Filled 2016-08-24 (×4): qty 1

## 2016-08-24 MED ORDER — HALOPERIDOL LACTATE 2 MG/ML PO CONC
0.5000 mg | ORAL | Status: DC | PRN
  Filled 2016-08-24: qty 0.3

## 2016-08-24 MED ORDER — SODIUM CHLORIDE 0.9 % IV SOLN
2.0000 mg/h | INTRAVENOUS | Status: DC
  Administered 2016-08-24 – 2016-08-26 (×3): 2 mg/h via INTRAVENOUS
  Filled 2016-08-24 (×3): qty 10

## 2016-08-24 MED ORDER — GLYCOPYRROLATE 1 MG PO TABS: 1.0000 mg | ORAL_TABLET | ORAL | Status: DC | PRN

## 2016-08-24 MED ORDER — HALOPERIDOL 1 MG PO TABS: 0.5000 mg | ORAL_TABLET | ORAL | Status: DC | PRN

## 2016-08-24 MED ORDER — MIDAZOLAM BOLUS VIA INFUSION
2.0000 mg | INTRAVENOUS | Status: DC | PRN
  Administered 2016-08-24 (×2): 2 mg via INTRAVENOUS
  Filled 2016-08-24: qty 4

## 2016-08-24 NOTE — Progress Notes (Signed)
Patient terminally extubated to room air per MD order. No complications. Family at bedside. RT will continue to monitor.

## 2016-08-24 NOTE — Progress Notes (Signed)
Michelle [name] is a 36 y.o. female admitted on 08/24/2016 after being unresponsive at home with concern for drug overdose.  She had respiratory arrest leading to cardiac arrest with 10 minutes for ROSC.  She was noted to have myoclonic activity.  EEG showed burst suppression.  She developed infiltrate on chest xray and fever.  She was started on antibiotics for aspiration pneumonia.  Neurology was consulted to assist with neurologic prognosis.  Follow up CT head showed b/l cerebral edema.  Palliative care was consulted.  She was made DNR and plan to transition to comfort care.  Subjective: Breathing over vent.  BP 116/89 (BP Location: Left Arm)   Pulse (!) 105   Temp 99.9 F (37.7 C) (Rectal)   Resp 17   Ht  (1.626 m)   Wt 218 lb 11.1 oz (99.2 kg)   LMP  (LMP Unknown)   SpO2 100%   BMI 37.54 kg/m   I/O last 3 completed shifts: In: 2860.6 [I.V.:970.6; NG/GT:1890] Out: 1890 [Urine:1890]  Unresponsive.  Pupils dilated.  b/l rhonchi.  HR regular.  Abd soft.  1+ edema.  CMP Latest Ref Rng & Units 08/20/2016 08/19/2016 08/18/2016  Glucose 65 - 99 mg/dL 960(A) 540(J) 811(B)  BUN 6 - 20 mg/dL 19 14(N) 82(N)  Creatinine 0.44 - 1.00 mg/dL 5.62 1.30 8.65  Sodium 135 - 145 mmol/L 138 143 145  Potassium 3.5 - 5.1 mmol/L 4.8 4.5 4.0  Chloride 101 - 111 mmol/L 109 109 111  CO2 22 - 32 mmol/L 21(L) 25 26  Calcium 8.9 - 10.3 mg/dL 8.2(L) 9.2 9.1  Total Protein 6.5 - 8.1 g/dL - - -  Total Bilirubin 0.3 - 1.2 mg/dL - - -  Alkaline Phos 38 - 126 U/L - - -  AST 15 - 41 U/L - - -  ALT 14 - 54 U/L - - -    CBC Latest Ref Rng & Units 08/20/2016 08/19/2016 08/18/2016  WBC 4.0 - 10.5 K/uL 19.7(H) 13.9(H) 11.9(H)  Hemoglobin 12.0 - 15.0 g/dL 11.9(L) 11.3(L) 11.0(L)  Hematocrit 36.0 - 46.0 % 36.9 36.3 34.8(L)  Platelets 150 - 400 K/uL 445(H) 360 322    Drugs of Abuse     Component Value Date/Time   LABOPIA NONE DETECTED 08/10/2016 0123   COCAINSCRNUR NONE DETECTED 08/10/2016 0123   LABBENZ  NONE DETECTED 08/10/2016 0123   AMPHETMU NONE DETECTED 08/10/2016 0123   THCU NONE DETECTED 08/10/2016 0123   LABBARB NONE DETECTED 08/10/2016 0123      Assessment: Respiratory arrest leading to cardiac arrest Cardiogenic shock Acute renal failure Anoxic encephalopathy Cerebral edema Myoclonus Accidental drug overdose Aspiration pneumonia  Plan: DNR Tentative plan for one way extubation 4/21  Coralyn Helling, MD [facility] Pulmonary/Critical Care 08/24/2016, 8:12 AM Pager:  [phone] After 3pm call: [phone]
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
## 2016-08-24 NOTE — Progress Notes (Signed)
Spoke to the medical examiner Raiford Noble) on call to determine if this patient would be an appropriate candidate for examination after time of death - he determined she would NOT be a candidate because her urine drug screen was negative on admission. Will implement this decision when necessary.
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
## 2016-08-25 NOTE — Progress Notes (Signed)
Pt transferring to 6N29, 2 VM left for Britta Mccreedy (mom) about transferring pt.

## 2016-08-25 NOTE — Progress Notes (Signed)
Michelle [name] is a 36 y.o. female admitted on August 17, 2016 after being unresponsive at home with concern for drug overdose.  She had respiratory arrest leading to cardiac arrest with 10 minutes for ROSC.  She was noted to have myoclonic activity.  EEG showed burst suppression.  She developed infiltrate on chest xray and fever.  She was started on antibiotics for aspiration pneumonia.  Neurology was consulted to assist with neurologic prognosis.  Follow up CT head showed b/l cerebral edema.  Palliative care was consulted.  She was made DNR and plan to transition to comfort care.  She was extubated on 4/21.  Subjective: Appear comfortable.  BP 113/81   Pulse (!) 132   Temp 98.3 F (36.8 C) (Axillary)   Resp (!) 23   Ht  (1.626 m)   Wt 218 lb 11.1 oz (99.2 kg)   LMP  (LMP Unknown)   SpO2 (!) 80%   BMI 37.54 kg/m   Comatose.  Shallow respirations.  HR tachy.    Assessment: Respiratory arrest leading to cardiac arrest Cardiogenic shock Acute renal failure Anoxic encephalopathy Cerebral edema Myoclonus Accidental drug overdose Aspiration pneumonia  Plan: Comfort measures Transfer out of ICU  Coralyn Helling, MD [facility] Pulmonary/Critical Care 08/25/2016, 7:49 AM Pager:  [phone] After 3pm call: [phone]

## 2016-08-27 ENCOUNTER — Encounter (HOSPITAL_COMMUNITY): Payer: Self-pay | Admitting: Emergency Medicine

## 2016-08-28 ENCOUNTER — Telehealth: Payer: Self-pay

## 2016-08-28 NOTE — Telephone Encounter (Signed)
On 08/28/16 I received a death certificate from [employer] (original). The death certificate is for cremation. The patient is a patient of Doctor Tyson Alias. The death certificate will be taken to [facility] this am for signature.  On 09-22-2016 I received the death certificate back from Doctor Tyson Alias. I got the death certificate ready and called the funeral home to let them know the death certificate is ready for pickup. I also faxed a copy to the funeral home per the funeral home request.

## 2016-09-03 ENCOUNTER — Ambulatory Visit: Payer: Medicare Other

## 2016-09-03 NOTE — Progress Notes (Signed)
Went into patient's room to check on patient and found patient with no respirations, no pulse, and unable to hear heartbeat with stethoscope.  Fonnie Mu, RN and Shanon Brow, RN verified patient's condition.  MD paged and awaiting callback. Attempted to call patient's mother x 2 with no answer.  Will continue to try to reach family.  Hector Shade [place]

## 2016-09-03 NOTE — Progress Notes (Signed)
Nutrition Brief Note  Chart reviewed. Pt now transitioning to comfort care.  No further nutrition interventions warranted at this time.  Please re-consult as needed.   Rhyse Skowron A. Laytoya Ion, RD, LDN, CDE Pager: [phone] After hours Pager: [phone]  

## 2016-09-03 NOTE — Care Management Note (Signed)
Case Management Note  Patient Details  Name: Michelle [name] MRN: [id number] Date of Birth: 1980/09/29  Subjective/Objective:                    Action/Plan:   Expected Discharge Date:                  Expected Discharge Plan:  Hospice Medical Facility  In-House Referral:  Clinical Social Work  Discharge planning Services     Post Acute Care Choice:    Choice offered to:     DME Arranged:    DME Agency:     HH Arranged:    HH Agency:     Status of Service:  In process, will continue to follow  If discussed at Long Length of Stay Meetings, dates discussed:    Additional Comments:  Kingsley Plan, RN 2016/09/08, 11:41 AM

## 2016-09-03 NOTE — Progress Notes (Signed)
Palliative Medicine RN Note: Daily check for symptoms. Pt is on comfort drips (morphine and midazolam). She is warm, so I removed the blanket. Non-responsive to me. Resp are shallow and even with PAINAD 0. During my visit, resp became so shallow that I thought she had passed away, but she began to breath deeper after about a minute. Discussed pt with RN Lillia Abed and NT Byrd Hesselbach; this is how she has been all day. No family is at bedside.   Plan f/u by PMT member this afternoon to ensure continued comfort. Likely prognosis hours to days, but due to her age and reserves, prognosis may be on the longer side of that estimate.\  Michelle Batie G. Yicel Shannon, RN, BSN, [facility] August 27, 2016 10:28 AM Cell [phone] 8:00-4:00 Monday-Friday Office [phone]

## 2016-09-03 NOTE — Progress Notes (Signed)
Palliative Medicine RN Note: Notified by pt's RN Lillia Abed that she has passed away. I called ME on call Al Decant; pt is NOT an ME case.  Margret Chance Bradrick Kamau, RN, BSN, [facility] 08/31/2016 2:06 PM Cell [phone] 8:00-4:00 Monday-Friday Office [phone]

## 2016-09-03 NOTE — Progress Notes (Signed)
Michelle [name] is a 36 y.o. female admitted on 08/20/2016 after being unresponsive at home with concern for drug overdose.  She had respiratory arrest leading to cardiac arrest with 10 minutes for ROSC.  She was noted to have myoclonic activity.  EEG showed burst suppression.  She developed infiltrate on chest xray and fever.  She was started on antibiotics for aspiration pneumonia.  Neurology was consulted to assist with neurologic prognosis.  Follow up CT head showed b/l cerebral edema.  Palliative care was consulted.  She was made DNR and plan to transition to comfort care.  She was extubated on 4/21.  Subjective: Unresponsive   BP (!) 91/50 (BP Location: Left Arm)   Pulse (!) 122   Temp (!) 102.9 F (39.4 C) (Axillary)   Resp 20   Ht  (1.626 m)   Wt 218 lb 11.1 oz (99.2 kg)   LMP  (LMP Unknown)   SpO2 (!) 74%   BMI 37.54 kg/m   General appearance:  36 Year old  female, well nourished/ cachectic  NAD, currently in acute distress, confused,  conversant  Eyes: anicteric sclerae, moist conjunctivae; PERRL, EOMI bilaterally. Mouth:  membranes and no mucosal ulcerations; normal hard and soft palate Neck: Trachea midline; neck supple, no JVD Lungs/chest: , with normal respiratory effort and no intercostal retractions, scattered rhonchi  CV: RRR, no MRGs  Abdomen: Soft, non-tender; no masses or HSM Extremities: No peripheral edema or extremity lymphadenopathy Skin: Normal temperature, turgor and texture; no rash, ulcers or subcutaneous nodules Neuro/Psych: unresponsive   Impression/plan Accidental drug overdose  s/p respiratory arrests leading to cardiac arrest Cardiogenic shock ARF Anoxic Encephalopathy Cerebral Edema  Myoclonus Aspiration PNA Terminal care   Discussion  Plan Continue comfort focused care  -morphine & versed gtt Scheduled robinul for secretions Family support.  Will ask SW to eval re: Michelle [name]   Michelle [name] ACNP-BC [facility]  Pulmonary/Critical Care Pager # [phone] OR # [phone] if no answer  STAFF NOTE: I, Michelle Percy, [name] FACP have personally reviewed patient's available data, including medical history, events of note, physical examination and test results as part of my evaluation. I have discussed with resident/NP and other care providers such as pharmacist, RN and RRT. In addition, I personally evaluated patient and elicited key findings of: not awake, paradoxical int beathing pattern and cheyn stokes, , rr 24, needs more morphine to titrate RR 12-18, working on beacon [name]?, no family in room   Michelle [name]. Michelle Alias, [name], FACP Pgr: [phone] [place] [facility] Sep 14, 2016 12:14 PM

## 2016-09-03 DEATH — deceased

## 2016-10-04 NOTE — Discharge Summary (Signed)
Michelle [name] is a 36 y.o. female admitted on 08/04/2016 after being unresponsive at home with concern for drug overdose.  She had respiratory arrest leading to cardiac arrest with 10 minutes for ROSC.  She was noted to have myoclonic activity.  EEG showed burst suppression.  She developed infiltrate on chest xray and fever.  She was started on antibiotics for aspiration pneumonia.  Neurology was consulted to assist with neurologic prognosis.  Follow up CT head showed b/l cerebral edema.  Palliative care was consulted.  She was made DNR and plan to transition to comfort care.  She was extubated on 4/21.  Final diagnosis upon death  1. Anoxic brain injury  s/p cardiac arrest 2. Drug overdose 3. Severe Myoclonus 4. Comfort care  [name], MD, FACP Pgr: [phone] [place] [facility]
